# Patient Record
Sex: Male | Born: 1950 | Hispanic: No | Marital: Single | Smoking: Former smoker
Health system: Southern US, Community
[De-identification: ages and names within clinical notes are randomized; demographics above are authoritative.]

## PROBLEM LIST (undated history)

## (undated) DIAGNOSIS — K859 Acute pancreatitis without necrosis or infection, unspecified: Secondary | ICD-10-CM

## (undated) DIAGNOSIS — I10 Essential (primary) hypertension: Secondary | ICD-10-CM

## (undated) DIAGNOSIS — J45909 Unspecified asthma, uncomplicated: Secondary | ICD-10-CM

## (undated) HISTORY — PX: PARATHYROIDECTOMY: SHX19

## (undated) HISTORY — PX: HERNIA REPAIR: SHX51

---

## 2016-04-11 ENCOUNTER — Emergency Department
Admission: EM | Admit: 2016-04-11 | Discharge: 2016-04-11 | Disposition: A | Discharge status: Home / Self Care | Payer: Self-pay | Attending: Emergency Medicine | Admitting: Emergency Medicine

## 2016-04-11 ENCOUNTER — Emergency Department: Payer: Self-pay

## 2016-04-11 ENCOUNTER — Encounter: Payer: Self-pay | Admitting: Emergency Medicine

## 2016-04-11 DIAGNOSIS — R42 Dizziness and giddiness: Secondary | ICD-10-CM

## 2016-04-11 DIAGNOSIS — N289 Disorder of kidney and ureter, unspecified: Secondary | ICD-10-CM | POA: Insufficient documentation

## 2016-04-11 DIAGNOSIS — Z87891 Personal history of nicotine dependence: Secondary | ICD-10-CM | POA: Insufficient documentation

## 2016-04-11 DIAGNOSIS — I951 Orthostatic hypotension: Secondary | ICD-10-CM | POA: Insufficient documentation

## 2016-04-11 HISTORY — DX: Acute pancreatitis without necrosis or infection, unspecified: K85.90

## 2016-04-11 LAB — CBC
HCT: 45.3 % (ref 40.0–52.0)
Hemoglobin: 15.4 g/dL (ref 13.0–18.0)
MCH: 30.6 pg (ref 26.0–34.0)
MCHC: 33.9 g/dL (ref 32.0–36.0)
MCV: 90.1 fL (ref 80.0–100.0)
PLATELETS: 241 10*3/uL (ref 150–440)
RBC: 5.03 MIL/uL (ref 4.40–5.90)
RDW: 12.8 % (ref 11.5–14.5)
WBC: 5.3 10*3/uL (ref 3.8–10.6)

## 2016-04-11 LAB — BASIC METABOLIC PANEL
Anion gap: 10 (ref 5–15)
BUN: 17 mg/dL (ref 6–20)
CO2: 21 mmol/L — ABNORMAL LOW (ref 22–32)
CREATININE: 1.51 mg/dL — AB (ref 0.61–1.24)
Calcium: 9.3 mg/dL (ref 8.9–10.3)
Chloride: 107 mmol/L (ref 101–111)
GFR calc Af Amer: 55 mL/min — ABNORMAL LOW (ref >60)
GFR, EST NON AFRICAN AMERICAN: 47 mL/min — AB (ref >60)
Glucose, Bld: 110 mg/dL — ABNORMAL HIGH (ref 65–99)
POTASSIUM: 4.1 mmol/L (ref 3.5–5.1)
SODIUM: 138 mmol/L (ref 135–145)

## 2016-04-11 MED ORDER — MECLIZINE HCL 25 MG PO TABS
25.0000 mg | ORAL_TABLET | Freq: Once | ORAL | Status: AC
  Administered 2016-04-11: 25 mg via ORAL
  Filled 2016-04-11: qty 1

## 2016-04-11 MED ORDER — MECLIZINE HCL 25 MG PO TABS
ORAL_TABLET | ORAL | Status: AC
  Filled 2016-04-11: qty 1

## 2016-04-11 MED ORDER — SODIUM CHLORIDE 0.9 % IV BOLUS (SEPSIS)
1000.0000 mL | Freq: Once | INTRAVENOUS | Status: AC
  Administered 2016-04-11: 1000 mL via INTRAVENOUS

## 2016-04-11 MED ORDER — MECLIZINE HCL 32 MG PO TABS: 32.0000 mg | ORAL_TABLET | Freq: Three times a day (TID) | ORAL | 0 refills | Status: DC | PRN

## 2016-04-11 MED ORDER — IOPAMIDOL (ISOVUE-370) INJECTION 76%
75.0000 mL | Freq: Once | INTRAVENOUS | Status: AC | PRN
  Administered 2016-04-11: 75 mL via INTRAVENOUS

## 2016-04-11 MED ORDER — ONDANSETRON HCL 4 MG/2ML IJ SOLN
4.0000 mg | Freq: Once | INTRAMUSCULAR | Status: AC
  Administered 2016-04-11: 4 mg via INTRAVENOUS

## 2016-04-11 MED ORDER — MECLIZINE HCL 25 MG PO TABS
25.0000 mg | ORAL_TABLET | Freq: Once | ORAL | Status: AC
  Administered 2016-04-11: 25 mg via ORAL

## 2016-04-11 MED ORDER — ONDANSETRON HCL 4 MG/2ML IJ SOLN
INTRAMUSCULAR | Status: AC
  Filled 2016-04-11: qty 2

## 2016-04-11 NOTE — Discharge Instructions (Signed)
Please drink plenty of fluid to stay well hydrated, and to improve your kidney function. Please make an appointment with your primary care doctor to have your kidney function I and your blood pressure and heart rate rechecked.  Return to the emergency department if you develop severe pain, lightheadedness or dizziness, numbness tingling or weakness, chest pain, fainting, fever, or any other symptoms concerning to you.

## 2016-04-11 NOTE — ED Notes (Signed)
Nystagmus has resolved

## 2016-04-11 NOTE — ED Notes (Signed)
Pt c/o dizziness when bending over or turning head quickly for last 3 days. Denies any pain, NVD or SHOB. No weakness. Has had nasal congestion and posterior nasal drainage that he reports as yellow.

## 2016-04-11 NOTE — ED Provider Notes (Signed)
[facility] Emergency Department Provider Note  ____________________________________________  Time seen: Approximately 11:29 AM  I have reviewed the triage vital signs and the nursing notes.   HISTORY  Chief Complaint Dizziness    HPI Jeremy Frazier is a 65 y.o. male with no history of hypertension, hyperlipidemia or diabetes, no previous stroke, presenting with dizziness.The patient reports that several days ago he developed congestion without cough, sore throat, ear pain, or shortness of breath. 2 days ago, he was getting out of bed when he had the acute onset of room spinning dizziness with mild nausea. This lasted approximately 2-3 minutes. He has had multiple daily episodes that are similar in character and severity since then. He has had a mild but not severe headache, and denies any numbness tingling or weakness. No recent trauma, or changes in his medications. He does have a history of vertigo cannot remember if this is similar to his previous symptoms. His symptoms have not improved with Dramamine, and are worsened with head movements to the left or bending over.   Past Medical History:  Diagnosis Date  . Pancreatitis     There are no active problems to display for this patient.   Past Surgical History:  Procedure Laterality Date  . HERNIA REPAIR    . PARATHYROIDECTOMY        Allergies Morphine and related  No family history on file.  Social History Social History  Substance Use Topics  . Smoking status: Former Games developermoker  . Smokeless tobacco: Never Used  . Alcohol use No    Review of Systems Constitutional: No fever/chills.No lightheadedness. Positive dizziness. No syncope. Eyes: No visual changes. No blurred or double vision. ENT: No sore throat. Positive congestion without rhinorrhea. Cardiovascular: Denies chest pain. Denies palpitations. Respiratory: Denies shortness of breath.  No cough. Gastrointestinal: No abdominal pain.  No  nausea, no vomiting.  No diarrhea.  No constipation. No bloody stool or melena. Genitourinary: Negative for dysuria. Musculoskeletal: Negative for back pain. Skin: Negative for rash. Neurological: Positive for mild headache. No focal numbness, tingling or weakness. No difficulty walking. Positive dizziness.  10-point ROS otherwise negative.  ____________________________________________   PHYSICAL EXAM:  VITAL SIGNS: ED Triage Vitals  Enc Vitals Group     BP 04/11/16 0930 (!) 142/102     Pulse Rate 04/11/16 0930 91     Resp 04/11/16 0930 18     Temp 04/11/16 0930 97.8 F (36.6 C)     Temp Source 04/11/16 0930 Oral     SpO2 04/11/16 0930 97 %     Weight 04/11/16 0928 220 lb (99.8 kg)     Height 04/11/16 0928 6' (1.829 m)     Head Circumference --      Peak Flow --      Pain Score --      Pain Loc --      Pain Edu? --      Excl. in GC? --     Constitutional: Alert and oriented. Well appearing and in no acute distress. Answers questions appropriately. Eyes: Conjunctivae are normal.  EOMI. PERRLA. No scleral icterus. No horizontal or vertical nystagmus. EARS: TMs are clear without bulge, erythema or fluid bilaterally. Canals are clear as well. Head: Atraumatic. Nose: Positive congestion without rhinorrhea. Mouth/Throat: Mucous membranes are moist. No posterior pharyngeal erythema. No tonsillar swelling or exudate. Posterior palate is symmetric and uvula is midline. Neck: No stridor.  Supple.  No JVD. No meningismus. Cardiovascular: Normal rate, regular rhythm. No  murmurs, rubs or gallops.  Respiratory: Normal respiratory effort.  No accessory muscle use or retractions. Lungs CTAB.  No wheezes, rales or ronchi. Gastrointestinal: Soft, nontender and nondistended.  No guarding or rebound.  No peritoneal signs. Musculoskeletal: No LE edema. No ttp in the calves or palpable cords.  Negative Homan's sign. Neurologic: Alert and oriented 3. Speech is clear.  Face and smile symmetric.  Tongue is midline. EOMI, PERRLA, no horizontal or vertical nystagmus. No pronator drift. 5 out of 5 grip, biceps, triceps, hip flexors, plantar flexion and dorsiflexion. Normal sensation to light touch in the bilateral upper and lower extremities, and face. Normal heel-to-shin. Normal finger-nose-finger. Skin:  Skin is warm, dry and intact. No rash noted. Psychiatric: Mood and affect are normal. Speech and behavior are normal.  Normal judgement.  ____________________________________________   LABS (all labs ordered are listed, but only abnormal results are displayed)  Labs Reviewed  BASIC METABOLIC PANEL - Abnormal; Notable for the following:       Result Value   CO2 21 (*)    Glucose, Bld 110 (*)    Creatinine, Ser 1.51 (*)    GFR calc non Af Amer 47 (*)    GFR calc Af Amer 55 (*)    All other components within normal limits  CBC   ____________________________________________  EKG  ED ECG REPORT I, Rockne Menghini, the attending physician, personally viewed and interpreted this ECG.   Date: 04/11/2016  EKG Time: 929  Rate: 86  Rhythm: normal sinus rhythm  Axis: normal  Intervals:none  ST&T Change: No ST elevation.  ____________________________________________  RADIOLOGY  Ct Angio Head W Or Wo Contrast  Result Date: 04/11/2016 CLINICAL DATA:  Vertigo. Dizziness when bending over or turning the head quickly for the past 3 days. Nasal congestion and posterior nasal drainage. EXAM: CT ANGIOGRAPHY HEAD AND NECK TECHNIQUE: Multidetector CT imaging of the head and neck was performed using the standard protocol during bolus administration of intravenous contrast. Multiplanar CT image reconstructions and MIPs were obtained to evaluate the vascular anatomy. Carotid stenosis measurements (when applicable) are obtained utilizing NASCET criteria, using the distal internal carotid diameter as the denominator. CONTRAST:  75 mL Isovue 370 COMPARISON:  Head CT 04/11/2016 FINDINGS: CTA  NECK FINDINGS Aortic arch: Normal variant aortic arch branching pattern with common origin of the brachiocephalic and left common carotid arteries. Arch vessel origins are widely patent. Right carotid system: Proximal common carotid artery partially obscured by streak artifact from dense venous contrast. Otherwise patent without evidence of stenosis or dissection. Left carotid system: Patent without evidence of stenosis or dissection. Tortuous proximal common carotid artery. Vertebral arteries: Proximal right V1 segment including its origin is completely obscured by streak artifact from venous contrast. The remainder of the right vertebral artery is patent and unremarkable, as is the left vertebral artery. Vessels are codominant. Skeleton: Advanced lower cervical disc degeneration with moderate to severe neural foraminal stenosis at C6-7 and C7-T1. Other neck: No evidence of acute abnormality or mass. Upper chest: Unremarkable. Review of the MIP images confirms the above findings CTA HEAD FINDINGS Anterior circulation: Internal carotid arteries are widely patent from skullbase to carotid termini. ACAs and MCAs are patent without evidence of major branch occlusion or significant stenosis. Anterior communicating artery is unremarkable. No aneurysm. Posterior circulation: Intracranial vertebral arteries are widely patent to the basilar and codominant. PICA, AICA, and SCA origins are patent. Basilar artery is widely patent. There are small patent scratched of there small posterior communicating arteries bilaterally.  PCAs are patent without evidence of significant stenosis. No aneurysm. Venous sinuses: Patent. Anatomic variants: None. Delayed phase: No abnormal enhancement. Review of the MIP images confirms the above findings IMPRESSION: 1. No evidence of major vessel occlusion, significant stenosis, or dissection involving the major intracranial and cervical arterial vasculature. Suboptimal assessment of the proximal  right vertebral and proximal right common carotid arteries due to artifact. 2. Advanced lower cervical disc degeneration. Electronically Signed   By: Sebastian AcheAllen  Grady M.D.   On: 04/11/2016 12:55   Ct Head Wo Contrast  Result Date: 04/11/2016 CLINICAL DATA:  Dizziness. EXAM: CT HEAD WITHOUT CONTRAST TECHNIQUE: Contiguous axial images were obtained from the base of the skull through the vertex without intravenous contrast. COMPARISON:  None. FINDINGS: Brain: No mass lesion, hemorrhage, hydrocephalus, acute infarct, intra-axial, or extra-axial fluid collection. Vascular: No hyperdense vessel or unexpected calcification. Skull: Normal Sinuses/Orbits: Normal imaged orbits. Clear paranasal sinuses and mastoid air cells. Other: None IMPRESSION: Normal head CT for age. Electronically Signed   By: Jeronimo GreavesKyle  Talbot M.D.   On: 04/11/2016 10:31   Ct Angio Neck W And/or Wo Contrast  Result Date: 04/11/2016 CLINICAL DATA:  Vertigo. Dizziness when bending over or turning the head quickly for the past 3 days. Nasal congestion and posterior nasal drainage. EXAM: CT ANGIOGRAPHY HEAD AND NECK TECHNIQUE: Multidetector CT imaging of the head and neck was performed using the standard protocol during bolus administration of intravenous contrast. Multiplanar CT image reconstructions and MIPs were obtained to evaluate the vascular anatomy. Carotid stenosis measurements (when applicable) are obtained utilizing NASCET criteria, using the distal internal carotid diameter as the denominator. CONTRAST:  75 mL Isovue 370 COMPARISON:  Head CT 04/11/2016 FINDINGS: CTA NECK FINDINGS Aortic arch: Normal variant aortic arch branching pattern with common origin of the brachiocephalic and left common carotid arteries. Arch vessel origins are widely patent. Right carotid system: Proximal common carotid artery partially obscured by streak artifact from dense venous contrast. Otherwise patent without evidence of stenosis or dissection. Left carotid system:  Patent without evidence of stenosis or dissection. Tortuous proximal common carotid artery. Vertebral arteries: Proximal right V1 segment including its origin is completely obscured by streak artifact from venous contrast. The remainder of the right vertebral artery is patent and unremarkable, as is the left vertebral artery. Vessels are codominant. Skeleton: Advanced lower cervical disc degeneration with moderate to severe neural foraminal stenosis at C6-7 and C7-T1. Other neck: No evidence of acute abnormality or mass. Upper chest: Unremarkable. Review of the MIP images confirms the above findings CTA HEAD FINDINGS Anterior circulation: Internal carotid arteries are widely patent from skullbase to carotid termini. ACAs and MCAs are patent without evidence of major branch occlusion or significant stenosis. Anterior communicating artery is unremarkable. No aneurysm. Posterior circulation: Intracranial vertebral arteries are widely patent to the basilar and codominant. PICA, AICA, and SCA origins are patent. Basilar artery is widely patent. There are small patent scratched of there small posterior communicating arteries bilaterally. PCAs are patent without evidence of significant stenosis. No aneurysm. Venous sinuses: Patent. Anatomic variants: None. Delayed phase: No abnormal enhancement. Review of the MIP images confirms the above findings IMPRESSION: 1. No evidence of major vessel occlusion, significant stenosis, or dissection involving the major intracranial and cervical arterial vasculature. Suboptimal assessment of the proximal right vertebral and proximal right common carotid arteries due to artifact. 2. Advanced lower cervical disc degeneration. Electronically Signed   By: Sebastian AcheAllen  Grady M.D.   On: 04/11/2016 12:55    ____________________________________________  PROCEDURES  Procedure(s) performed: None  Procedures  Critical Care performed:  No ____________________________________________   INITIAL IMPRESSION / ASSESSMENT AND PLAN / ED COURSE  Pertinent labs & imaging results that were available during my care of the patient were reviewed by me and considered in my medical decision making (see chart for details).  65 y.o. male presenting with several days of self-limited 2-3 minute episodes of dizziness, worse with positional changes. On my examination, the patient has no focal neurologic findings, and his cerebellar testing is within normal limits. He does have some congestion, and the symptoms may be due to vertigo. In addition, the patient is orthostatic on examination with pulse jumping from 88-122 from lying down to standing. We will give him intravenous fluids, treat him with meclizine, and reevaluate the patient.   ----------------------------------------- 11:45 AM on 04/11/2016 -----------------------------------------  At this time, the patient's workup is most consistent with either vertigo or dehydration. He does have a bump in his creatinine, and is orthostatic. I'll given him intravenous fluids for this. He has not noticed a significant improvement with the first dose of meclizine, so we'll give him a second dose and more fluid as well. His CT scan does not show any acute intracranial process, and we'll get a CT angiogram to rule out posterior stroke. If his imaging is reassuring, his vital signs normalize, and he is feeling better, and discharge home with close PMD follow-up.  ____________________________________________  FINAL CLINICAL IMPRESSION(S) / ED DIAGNOSES  Final diagnoses:  Vertigo  Acute renal insufficiency  Orthostasis    Clinical Course       NEW MEDICATIONS STARTED DURING THIS VISIT:  Discharge Medication List as of 04/11/2016  1:11 PM    START taking these medications   Details  meclizine (ANTIVERT) 32 MG tablet Take 1 tablet (32 mg total) by mouth 3 (three) times daily as needed.,  Starting Sun 04/11/2016, Print          Rockne Menghini, MD 04/11/16 1534

## 2016-04-11 NOTE — ED Triage Notes (Addendum)
Pt presents to ED wit reports of dizzy spells for two days. Pt denies any pain, SOB, or NVD. Pt reports nasal congestion. Pt reports taking dramamine with no relief.

## 2017-09-09 ENCOUNTER — Encounter: Payer: Self-pay | Admitting: Emergency Medicine

## 2017-09-09 ENCOUNTER — Emergency Department
Admission: EM | Admit: 2017-09-09 | Discharge: 2017-09-10 | Disposition: A | Discharge status: Home / Self Care | Payer: Non-veteran care | Attending: Emergency Medicine | Admitting: Emergency Medicine

## 2017-09-09 ENCOUNTER — Emergency Department: Payer: Non-veteran care

## 2017-09-09 DIAGNOSIS — R0602 Shortness of breath: Secondary | ICD-10-CM | POA: Diagnosis present

## 2017-09-09 DIAGNOSIS — Z79899 Other long term (current) drug therapy: Secondary | ICD-10-CM | POA: Insufficient documentation

## 2017-09-09 DIAGNOSIS — J441 Chronic obstructive pulmonary disease with (acute) exacerbation: Secondary | ICD-10-CM | POA: Insufficient documentation

## 2017-09-09 DIAGNOSIS — Z87891 Personal history of nicotine dependence: Secondary | ICD-10-CM | POA: Insufficient documentation

## 2017-09-09 LAB — BASIC METABOLIC PANEL
Anion gap: 8 (ref 5–15)
BUN: 15 mg/dL (ref 6–20)
CALCIUM: 9.3 mg/dL (ref 8.9–10.3)
CO2: 22 mmol/L (ref 22–32)
CREATININE: 1.51 mg/dL — AB (ref 0.61–1.24)
Chloride: 108 mmol/L (ref 101–111)
GFR calc non Af Amer: 47 mL/min — ABNORMAL LOW (ref >60)
GFR, EST AFRICAN AMERICAN: 55 mL/min — AB (ref >60)
Glucose, Bld: 115 mg/dL — ABNORMAL HIGH (ref 65–99)
Potassium: 3.9 mmol/L (ref 3.5–5.1)
SODIUM: 138 mmol/L (ref 135–145)

## 2017-09-09 LAB — CBC
HCT: 40.6 % (ref 40.0–52.0)
Hemoglobin: 13.9 g/dL (ref 13.0–18.0)
MCH: 30.5 pg (ref 26.0–34.0)
MCHC: 34.1 g/dL (ref 32.0–36.0)
MCV: 89.4 fL (ref 80.0–100.0)
PLATELETS: 259 10*3/uL (ref 150–440)
RBC: 4.55 MIL/uL (ref 4.40–5.90)
RDW: 12.5 % (ref 11.5–14.5)
WBC: 5.6 10*3/uL (ref 3.8–10.6)

## 2017-09-09 LAB — FIBRIN DERIVATIVES D-DIMER (ARMC ONLY): Fibrin derivatives D-dimer (ARMC): 415.57 ng/mL (FEU) (ref 0.00–499.00)

## 2017-09-09 LAB — TROPONIN I

## 2017-09-09 MED ORDER — IPRATROPIUM-ALBUTEROL 0.5-2.5 (3) MG/3ML IN SOLN
3.0000 mL | Freq: Once | RESPIRATORY_TRACT | Status: AC
  Administered 2017-09-09: 3 mL via RESPIRATORY_TRACT
  Filled 2017-09-09: qty 3

## 2017-09-09 MED ORDER — ALBUTEROL SULFATE (2.5 MG/3ML) 0.083% IN NEBU
5.0000 mg | INHALATION_SOLUTION | Freq: Once | RESPIRATORY_TRACT | Status: AC
  Administered 2017-09-09: 5 mg via RESPIRATORY_TRACT
  Filled 2017-09-09: qty 6

## 2017-09-09 MED ORDER — PREDNISONE 20 MG PO TABS
40.0000 mg | ORAL_TABLET | ORAL | Status: AC
  Administered 2017-09-09: 40 mg via ORAL
  Filled 2017-09-09: qty 2

## 2017-09-09 NOTE — ED Triage Notes (Signed)
Patient states he has had intermittent SOB for the last 2 weeks and he was at [facility] yesterday and they gave him an inhaler and Montelukast for breathing relief and he states he has only taken 2 of them but is unsure if they have had time to work.  He states he has not been dx with asthma or other chronic breathing issues.  Pt denies smoking.

## 2017-09-09 NOTE — ED Notes (Signed)
Patient transported to X-ray 

## 2017-09-10 MED ORDER — PREDNISONE 20 MG PO TABS: 40.0000 mg | ORAL_TABLET | Freq: Every day | ORAL | 0 refills | Status: DC

## 2017-09-10 NOTE — Discharge Instructions (Signed)
Your labs and chest xray today were unremarkable.

## 2017-09-10 NOTE — ED Provider Notes (Signed)
[facility] Emergency Department Provider Note  ____________________________________________  Time seen: Approximately 12:01 AM  I have reviewed the triage vital signs and the nursing notes.   HISTORY  Chief Complaint Shortness of Breath    HPI Jeremy Frazier is a 67 y.o. male who complains of shortness of breath on and off for the past 2 weeks. It improves with albuterol and steroids but then reoccurs. He is been followed up with the [place], where they have him on an albuterol inhaler. They do start him on on Singulair yesterday but he is not seeing improvement yet. last steroids is about 2 weeks ago. He also had a course of antibiotics. He denies any fever or productive cough. no aggravating factors. denies chest pain, no recent travel, hospitalization or surgery or history of PE or DVT. Symptoms are moderate severity.  On arrival to the ED he received albuterol and feels much better now.     Past Medical History:  Diagnosis Date  . Pancreatitis      There are no active problems to display for this patient.    Past Surgical History:  Procedure Laterality Date  . HERNIA REPAIR    . PARATHYROIDECTOMY       Prior to Admission medications   Medication Sig Start Date End Date Taking? Authorizing Provider  meclizine (ANTIVERT) 32 MG tablet Take 1 tablet (32 mg total) by mouth 3 (three) times daily as needed. 04/11/16   Rockne Menghini, MD  predniSONE (DELTASONE) 20 MG tablet Take 2 tablets (40 mg total) by mouth daily. 09/10/17   Sharman Cheek, MD     Allergies Morphine and related   No family history on file.  Social History Social History   Tobacco Use  . Smoking status: Former Games developer  . Smokeless tobacco: Never Used  Substance Use Topics  . Alcohol use: No  . Drug use: Not on file    Review of Systems  Constitutional:   No fever or chills.  ENT:   No sore throat. No rhinorrhea. Cardiovascular:   No chest pain or  syncope. Respiratory:   positive shortness of breath without cough. Gastrointestinal:   Negative for abdominal pain, vomiting and diarrhea.  Musculoskeletal:   Negative for focal pain or swelling All other systems reviewed and are negative except as documented above in ROS and HPI.  ____________________________________________   PHYSICAL EXAM:  VITAL SIGNS: ED Triage Vitals  Enc Vitals Group     BP 09/09/17 2115 (!) 146/93     Pulse Rate 09/09/17 2115 (!) 101     Resp 09/09/17 2200 14     Temp 09/09/17 2115 98.5 F (36.9 C)     Temp Source 09/09/17 2115 Oral     SpO2 09/09/17 2115 98 %     Weight 09/09/17 2115 220 lb (99.8 kg)     Height 09/09/17 2115 6' (1.829 m)     Head Circumference --      Peak Flow --      Pain Score 09/09/17 2115 0     Pain Loc --      Pain Edu? --      Excl. in GC? --     Vital signs reviewed, nursing assessments reviewed.   Constitutional:   Alert and oriented. Well appearing and in no distress. Eyes:   Conjunctivae are normal. EOMI. PERRL. ENT      Head:   Normocephalic and atraumatic.      Nose:   No congestion/rhinnorhea.  Mouth/Throat:   MMM, no pharyngeal erythema. No peritonsillar mass.       Neck:   No meningismus. Full ROM. Hematological/Lymphatic/Immunilogical:   No cervical lymphadenopathy. Cardiovascular:   RRR. Symmetric bilateral radial and DP pulses.  No murmurs.  Respiratory:   Normal respiratory effort without tachypnea/retractions. Breath sounds are clear and equal bilaterally. No wheezes/rales/rhonchi.no wheezing or coughing fit provoked with FEV1 maneuver. Gastrointestinal:   Soft and nontender. Non distended. There is no CVA tenderness.  No rebound, rigidity, or guarding. Genitourinary:   deferred Musculoskeletal:   Normal range of motion in all extremities. No joint effusions.  No lower extremity tenderness.  No edema. Neurologic:   Normal speech and language.  Motor grossly intact. No acute focal neurologic  deficits are appreciated.  Skin:    Skin is warm, dry and intact. No rash noted.  No petechiae, purpura, or bullae.  ____________________________________________    LABS (pertinent positives/negatives) (all labs ordered are listed, but only abnormal results are displayed) Labs Reviewed  BASIC METABOLIC PANEL - Abnormal; Notable for the following components:      Result Value   Glucose, Bld 115 (*)    Creatinine, Ser 1.51 (*)    GFR calc non Af Amer 47 (*)    GFR calc Af Amer 55 (*)    All other components within normal limits  CBC  TROPONIN I  FIBRIN DERIVATIVES D-DIMER ([facility] ONLY)   ____________________________________________   EKG  interpreted by me  Date: 09/10/2017  Rate: 81  Rhythm: normal sinus rhythm  QRS Axis: normal  Intervals: normal  ST/T Wave abnormalities: normal  Conduction Disutrbances: none  Narrative Interpretation: unremarkable      ____________________________________________    RADIOLOGY  Dg Chest 2 View  Result Date: 09/09/2017 CLINICAL DATA:  Shortness of breath EXAM: CHEST - 2 VIEW COMPARISON:  None. FINDINGS: The lungs are clear without focal pneumonia, edema, pneumothorax or pleural effusion. The cardiopericardial silhouette is within normal limits for size. Accentuated thoracic kyphosis. Thoracolumbar scoliosis evident. Old right rib fracture noted IMPRESSION: No active cardiopulmonary disease. Electronically Signed   By: Kennith CenterEric  Mansell M.D.   On: 09/09/2017 21:50    ____________________________________________   PROCEDURES Procedures  ____________________________________________  DIFFERENTIAL DIAGNOSIS   COPD exacerbation, PE  CLINICAL IMPRESSION / ASSESSMENT AND PLAN / ED COURSE  Pertinent labs & imaging results that were available during my care of the patient were reviewed by me and considered in my medical decision making (see chart for details).    is well-appearing no acute distress, responded well to albuterol, exam is  essentially normal, normal vital signs, very low suspicion for PE, however with his prolonged course of shortness of breath and waxing and waning symptoms, I will get a d-dimer for further risk stratification. If negative I do not think he needs a CT angiogram of the chest. Low suspicion of ACS dissection pericarditis or pneumothorax.  Clinical Course as of Apr 06 0001  Fri Sep 09, 2017  2304 WNL. DC with course of steroids, continue albuterol.   Fibrin derivatives D-dimer [facility]): 415.57 [PS]    Clinical Course User Index [PS] Sharman CheekStafford, Virgen Belland, MD     ____________________________________________   FINAL CLINICAL IMPRESSION(S) / ED DIAGNOSES    Final diagnoses:  COPD exacerbation (HCC)  SOB (shortness of breath)     ED Discharge Orders        Ordered    predniSONE (DELTASONE) 20 MG tablet  Daily     09/10/17 0000  Portions of this note were generated with dragon dictation software. Dictation errors may occur despite best attempts at proofreading.    Sharman Cheek, MD 09/10/17 [phone]

## 2019-01-07 ENCOUNTER — Emergency Department
Admission: EM | Admit: 2019-01-07 | Discharge: 2019-01-07 | Disposition: A | Discharge status: Home / Self Care | Payer: No Typology Code available for payment source | Attending: Emergency Medicine | Admitting: Emergency Medicine

## 2019-01-07 ENCOUNTER — Other Ambulatory Visit: Payer: Self-pay

## 2019-01-07 ENCOUNTER — Encounter: Payer: Self-pay | Admitting: Emergency Medicine

## 2019-01-07 DIAGNOSIS — R0981 Nasal congestion: Secondary | ICD-10-CM | POA: Diagnosis present

## 2019-01-07 DIAGNOSIS — J0100 Acute maxillary sinusitis, unspecified: Secondary | ICD-10-CM | POA: Insufficient documentation

## 2019-01-07 DIAGNOSIS — Z79899 Other long term (current) drug therapy: Secondary | ICD-10-CM | POA: Diagnosis not present

## 2019-01-07 DIAGNOSIS — J45909 Unspecified asthma, uncomplicated: Secondary | ICD-10-CM | POA: Diagnosis not present

## 2019-01-07 DIAGNOSIS — Z87891 Personal history of nicotine dependence: Secondary | ICD-10-CM | POA: Insufficient documentation

## 2019-01-07 HISTORY — DX: Unspecified asthma, uncomplicated: J45.909

## 2019-01-07 MED ORDER — AMOXICILLIN 500 MG PO CAPS
500.0000 mg | ORAL_CAPSULE | Freq: Once | ORAL | Status: AC
  Administered 2019-01-07: 500 mg via ORAL
  Filled 2019-01-07: qty 1

## 2019-01-07 MED ORDER — AMOXICILLIN 500 MG PO TABS: 500.0000 mg | ORAL_TABLET | Freq: Three times a day (TID) | ORAL | 0 refills | Status: DC

## 2019-01-07 NOTE — ED Triage Notes (Signed)
Pt to ED with c/o of congestion that has been ongoing for approx one week. Pt denies cough and SOB.

## 2019-01-07 NOTE — Discharge Instructions (Signed)
Please follow-up with your primary care provider for symptoms that are not improving over the next 2 to 3 days while taking your antibiotic.  You may continue taking your Mucinex in addition to Tylenol if needed.  Return to the ER for symptoms of change or worsen if you are unable to schedule appointment.

## 2019-01-07 NOTE — ED Provider Notes (Signed)
[facility] Emergency Department Provider Note  ____________________________________________  Time seen: Approximately 7:22 PM  I have reviewed the triage vital signs and the nursing notes.   HISTORY  Chief Complaint Nasal Congestion   HPI Jeremy Frazier is a 68 y.o. male who presents to the emergency department for treatment and evaluation  of congestion that is been present for the approximately 1 week. Headache started yesterday.  No fever, cough or shortness of breath.    Past Medical History:  Diagnosis Date  . Asthma   . Pancreatitis     There are no active problems to display for this patient.   Past Surgical History:  Procedure Laterality Date  . HERNIA REPAIR    . PARATHYROIDECTOMY      Prior to Admission medications   Medication Sig Start Date End Date Taking? Authorizing Provider  amoxicillin (AMOXIL) 500 MG tablet Take 1 tablet (500 mg total) by mouth 3 (three) times daily. 01/07/19   Demere Dotzler, Rulon Eisenmengerari B, FNP  meclizine (ANTIVERT) 32 MG tablet Take 1 tablet (32 mg total) by mouth 3 (three) times daily as needed. 04/11/16   Rockne MenghiniNorman, Anne-Caroline, MD  predniSONE (DELTASONE) 20 MG tablet Take 2 tablets (40 mg total) by mouth daily. 09/10/17   Sharman CheekStafford, Phillip, MD    Allergies Morphine and related  No family history on file.  Social History Social History   Tobacco Use  . Smoking status: Former Games developermoker  . Smokeless tobacco: Never Used  Substance Use Topics  . Alcohol use: No  . Drug use: Not on file    Review of Systems Constitutional: Negative for  fever/chills. Normal appetite. ENT: No sore throat. Positive for sinus congestion. Cardiovascular: Denies chest pain. Respiratory: Negative for shortness of breath. Negative for cough. Negative for wheezing.  Gastrointestinal: Negative for nausea,  no vomiting.  no diarrhea.  Musculoskeletal: Negative for for body aches Skin: Negative for rash. Neurological: Positive for  headaches ____________________________________________   PHYSICAL EXAM:  VITAL SIGNS: ED Triage Vitals  Enc Vitals Group     BP 01/07/19 1809 (!) 151/93     Pulse Rate 01/07/19 1809 90     Resp 01/07/19 1809 18     Temp 01/07/19 1809 98.7 F (37.1 C)     Temp Source 01/07/19 1809 Oral     SpO2 01/07/19 1809 99 %     Weight 01/07/19 1812 220 lb (99.8 kg)     Height 01/07/19 1812 6' (1.829 m)     Head Circumference --      Peak Flow --      Pain Score 01/07/19 1811 7     Pain Loc --      Pain Edu? --      Excl. in GC? --     Constitutional: Alert and oriented. Overall well appearing and in no acute distress. Eyes: Conjunctivae are normal. Ears: Bilateral TM normal Nose: Maxillary and ethmoid sinus congestion noted; no rhinnorhea. Mouth/Throat: Mucous membranes are moist.  Oropharynx clear. Tonsils normal. Uvula midline. Neck: No stridor.  Lymphatic: No cervical lymphadenopathy. Cardiovascular: Normal rate, regular rhythm. Good peripheral circulation. Respiratory: Respirations are even and unlabored.  No retractions. Breath sounds clear to auscultation. Gastrointestinal: Soft and nontender.  Musculoskeletal: FROM x 4 extremities.  Neurologic:  Normal speech and language. Skin:  Skin is warm, dry and intact. No rash noted. Psychiatric: Mood and affect are normal. Speech and behavior are normal.  ____________________________________________   LABS (all labs ordered are listed, but only abnormal  results are displayed)  Labs Reviewed - No data to display ____________________________________________  EKG  Not indicated. ____________________________________________  RADIOLOGY  Not indicated. ____________________________________________   PROCEDURES  Procedure(s) performed: None  Critical Care performed: No ____________________________________________   INITIAL IMPRESSION / ASSESSMENT AND PLAN / ED COURSE  68 y.o. male who presents to the emergency  department for treatment and evaluation of nasal congestion that has been present and worsening for the past week.  No relief with Mucinex.  He will be treated with amoxicillin for presumed sinus infection.  He is to follow-up with his primary care provider if not improving with medication over the next few days.  He is to return to the emergency department for symptoms of change or worsen if unable to schedule an appointment.     Medications  amoxicillin (AMOXIL) capsule 500 mg (has no administration in time range)    ED Discharge Orders         Ordered    amoxicillin (AMOXIL) 500 MG tablet  3 times daily     01/07/19 1935           Pertinent labs & imaging results that were available during my care of the patient were reviewed by me and considered in my medical decision making (see chart for details).    If controlled substance prescribed during this visit, 12 month history viewed on the [place] prior to issuing an initial prescription for Schedule II or III opiod. ____________________________________________   FINAL CLINICAL IMPRESSION(S) / ED DIAGNOSES  Final diagnoses:  Acute non-recurrent maxillary sinusitis    Note:  This document was prepared using Dragon voice recognition software and may include unintentional dictation errors.    Victorino Dike, FNP 01/07/19 1937    Nena Polio, MD 01/07/19 2138

## 2019-06-20 ENCOUNTER — Other Ambulatory Visit: Payer: Self-pay

## 2019-06-20 ENCOUNTER — Encounter: Payer: Self-pay | Admitting: Emergency Medicine

## 2019-06-20 ENCOUNTER — Emergency Department
Admission: EM | Admit: 2019-06-20 | Discharge: 2019-06-20 | Disposition: A | Discharge status: Home / Self Care | Payer: No Typology Code available for payment source | Attending: Emergency Medicine | Admitting: Emergency Medicine

## 2019-06-20 DIAGNOSIS — Z87891 Personal history of nicotine dependence: Secondary | ICD-10-CM | POA: Insufficient documentation

## 2019-06-20 DIAGNOSIS — Z79899 Other long term (current) drug therapy: Secondary | ICD-10-CM | POA: Diagnosis not present

## 2019-06-20 DIAGNOSIS — J45909 Unspecified asthma, uncomplicated: Secondary | ICD-10-CM | POA: Insufficient documentation

## 2019-06-20 DIAGNOSIS — K029 Dental caries, unspecified: Secondary | ICD-10-CM | POA: Insufficient documentation

## 2019-06-20 DIAGNOSIS — K0889 Other specified disorders of teeth and supporting structures: Secondary | ICD-10-CM | POA: Diagnosis present

## 2019-06-20 MED ORDER — AMOXICILLIN 500 MG PO CAPS: 500.0000 mg | ORAL_CAPSULE | Freq: Three times a day (TID) | ORAL | 0 refills | Status: DC

## 2019-06-20 MED ORDER — IBUPROFEN 600 MG PO TABS: 600.0000 mg | ORAL_TABLET | Freq: Three times a day (TID) | ORAL | 0 refills | Status: DC | PRN

## 2019-06-20 MED ORDER — TRAMADOL HCL 50 MG PO TABS: 50.0000 mg | ORAL_TABLET | Freq: Four times a day (QID) | ORAL | 0 refills | Status: DC | PRN

## 2019-06-20 MED ORDER — LIDOCAINE VISCOUS HCL 2 % MT SOLN
15.0000 mL | Freq: Once | OROMUCOSAL | Status: AC
  Administered 2019-06-20: 12:00:00 15 mL via OROMUCOSAL
  Filled 2019-06-20: qty 15

## 2019-06-20 NOTE — Discharge Instructions (Addendum)
Follow discharge care instruction.  Take medication as directed.  Establish care from list of dental clinics provided you discharge.  OPTIONS FOR DENTAL FOLLOW UP CARE  [place] [facility] - Local Safety Net Dental Clinics [email or web address]   [facility] [phone])  [facility] [phone])  [facility] [phone] ext [phone])  [facility] [phone])  [facility] [phone]) This clinic caters to the indigent population and is on a lottery system. Location: [employer] of Dentistry, [employer], [address] [place] Clinic Hours: Wednesdays from 6pm - 9pm, patients seen by a lottery system. For dates, call or go to [email or web address] Services: Cleanings, fillings and simple extractions. Payment Options: DENTAL WORK IS FREE OF CHARGE. Bring proof of income or support. Best way to get seen: Arrive at 5:15 pm - this is a lottery, NOT first come/first serve, so arriving earlier will not increase your chances of being seen.     [facility] [phone] Select option 1 for emergencies   Location: [facility] of Dentistry, [place], [address] [place] Clinic Hours: No walk-ins accepted - call the day before to schedule an appointment. Check in times are 9:30 am and 1:30 pm. Services: Simple extractions, temporary fillings, pulpectomy/pulp debridement, uncomplicated abscess drainage. Payment Options: PAYMENT IS DUE AT THE TIME OF SERVICE.  Fee is usually $100-200, additional surgical procedures (e.g. abscess drainage) may be extra. Cash, checks, Visa/MasterCard accepted.  Can file Medicaid if patient is covered for dental - patient should call case worker to check. No discount for [facility] patients. Best way to get seen: MUST call the day before and get onto  the schedule. Can usually be seen the next 1-2 days. No walk-ins accepted.     [facility] [phone]   Location: [facility], [address] [place] Clinic Hours: M, W, Th, F 8am or 1:30pm, Tues 9a or 1:30 - first come/first served. Services: Simple extractions, temporary fillings, uncomplicated abscess drainage.  You do not need to be an [facility] resident. Payment Options: PAYMENT IS DUE AT THE TIME OF SERVICE. Dental insurance, otherwise sliding scale - bring proof of income or support. Depending on income and treatment needed, cost is usually $50-200. Best way to get seen: Arrive early as it is first come/first served.     [facility] [phone]   Location: [address] Clinic Hours: Mon-Thu 8a-5p Services: Most basic dental services including extractions and fillings. Payment Options: PAYMENT IS DUE AT THE TIME OF SERVICE. Sliding scale, up to 50% off - bring proof if income or support. Medicaid with dental option accepted. Best way to get seen: Call to schedule an appointment, can usually be seen within 2 weeks OR they will try to see walk-ins - show up at 8a or 2p (you may have to wait).     [facility] [phone] [place] RESIDENTS ONLY   Location: [facility], [address] [place], [place] [address] Clinic Hours: By appointment only. Monday - Thursday 8am-5pm, Friday 8am-12pm Services: Cleanings, fillings, extractions. Payment Options: PAYMENT IS DUE AT THE TIME OF SERVICE. Cash, Visa or MasterCard. Sliding scale - $30 minimum per service. Best way to get seen: Come in to office, complete packet and make an appointment - need proof of income or support monies for each household member and proof of [facility] residence. Usually takes about  a month to get in.     [facility] [phone]    Location: [address] [place] Clinic Hours: Walk-in Urgent Care Dental Services are offered Monday-Friday mornings only. The numbers of emergencies accepted daily is limited to the number of providers available. Maximum 15 - Mondays, Wednesdays & Thursdays Maximum 10 - Tuesdays & Fridays Services: You do not need to be a [facility] resident to be seen for a dental emergency. Emergencies are defined as pain, swelling, abnormal bleeding, or dental trauma. Walkins will receive x-rays if needed. NOTE: Dental cleaning is not an emergency. Payment Options: PAYMENT IS DUE AT THE TIME OF SERVICE. Minimum co-pay is $40.00 for uninsured patients. Minimum co-pay is $3.00 for Medicaid with dental coverage. Dental Insurance is accepted and must be presented at time of visit. Medicare does not cover dental. Forms of payment: Cash, credit card, checks. Best way to get seen: If not previously registered with the clinic, walk-in dental registration begins at 7:15 am and is on a first come/first serve basis. If previously registered with the clinic, call to make an appointment.     [facility] [place] ONLY   Location: [address] [place], [place] Clinic Hours: Mon-Thu 10a-2p Services: Extractions only! Payment Options: FREE (donations accepted) - bring proof of income or support Best way to get seen: Call and schedule an appointment OR come at 8am on the 1st Monday of every month (except for holidays) when it is first come/first served.     [facility] [phone]   Location: [place], [place] Clinic Hours: Friday mornings Services, Payment Options, Best way to get seen: Call for info

## 2019-06-20 NOTE — ED Provider Notes (Signed)
[facility] Emergency Department Provider Note   ____________________________________________   First MD Initiated Contact with Patient 06/20/19 1108     (approximate)  I have reviewed the triage vital signs and the nursing notes.   HISTORY  Chief Complaint Dental Pain and Oral Swelling    HPI Jeremy Frazier is a 69 y.o. male patient presents with 3 days of dental pain involving the right upper tooth.  No fever associated with complaint.  Patient rates pain as a 10/10.  Patient described the pain as "achy".  No palliative measure for complaint.         Past Medical History:  Diagnosis Date  . Asthma   . Pancreatitis     There are no problems to display for this patient.   Past Surgical History:  Procedure Laterality Date  . HERNIA REPAIR    . PARATHYROIDECTOMY      Prior to Admission medications   Medication Sig Start Date End Date Taking? Authorizing Provider  amoxicillin (AMOXIL) 500 MG capsule Take 1 capsule (500 mg total) by mouth 3 (three) times daily. 06/20/19   Joni Reining, PA-C  amoxicillin (AMOXIL) 500 MG tablet Take 1 tablet (500 mg total) by mouth 3 (three) times daily. 01/07/19   Triplett, Cari B, FNP  ibuprofen (ADVIL) 600 MG tablet Take 1 tablet (600 mg total) by mouth every 8 (eight) hours as needed. 06/20/19   Joni Reining, PA-C  meclizine (ANTIVERT) 32 MG tablet Take 1 tablet (32 mg total) by mouth 3 (three) times daily as needed. 04/11/16   Rockne Menghini, MD  predniSONE (DELTASONE) 20 MG tablet Take 2 tablets (40 mg total) by mouth daily. 09/10/17   Sharman Cheek, MD  traMADol (ULTRAM) 50 MG tablet Take 1 tablet (50 mg total) by mouth every 6 (six) hours as needed. 06/20/19 06/19/20  Joni Reining, PA-C    Allergies Morphine and related  No family history on file.  Social History Social History   Tobacco Use  . Smoking status: Former Games developer  . Smokeless tobacco: Never Used  Substance Use Topics  .  Alcohol use: No  . Drug use: Not on file    Review of Systems Constitutional: No fever/chills Eyes: No visual changes. ENT: No sore throat. Cardiovascular: Denies chest pain. Respiratory: Denies shortness of breath. Gastrointestinal: No abdominal pain.  No nausea, no vomiting.  No diarrhea.  No constipation.  Pancreatitis Genitourinary: Negative for dysuria. Musculoskeletal: Negative for back pain. Skin: Negative for rash. Neurological: Negative for headaches, focal weakness or numbness. Allergic/Immunilogical: Morphine ____________________________________________   PHYSICAL EXAM:  VITAL SIGNS: ED Triage Vitals  Enc Vitals Group     BP 06/20/19 1111 (!) 146/102     Pulse Rate 06/20/19 1111 99     Resp 06/20/19 1111 18     Temp 06/20/19 1111 98.1 F (36.7 C)     Temp Source 06/20/19 1111 Oral     SpO2 06/20/19 1111 97 %     Weight 06/20/19 1103 220 lb (99.8 kg)     Height 06/20/19 1103 6' (1.829 m)     Head Circumference --      Peak Flow --      Pain Score 06/20/19 1102 10     Pain Loc --      Pain Edu? --      Excl. in GC? --    Constitutional: Alert and oriented. Well appearing and in no acute distress. Mouth/Throat: Mucous membranes are moist.  Oropharynx non-erythematous.  Devitalized tooth #14 with gingiva edema. Hematological/Lymphatic/Immunilogical: No cervical lymphadenopathy. Cardiovascular: Normal rate, regular rhythm. Grossly normal heart sounds.  Good peripheral circulation.  Elevated blood pressure Respiratory: Normal respiratory effort.  No retractions. Lungs CTAB. Skin:  Skin is warm, dry and intact. No rash noted. Psychiatric: Mood and affect are normal. Speech and behavior are normal.  ____________________________________________   LABS (all labs ordered are listed, but only abnormal results are displayed)  Labs Reviewed - No data to  display ____________________________________________  EKG   ____________________________________________  RADIOLOGY  ED MD interpretation:    Official radiology report(s): No results found.  ____________________________________________   PROCEDURES  Procedure(s) performed (including Critical Care):  Procedures   ____________________________________________   INITIAL IMPRESSION / ASSESSMENT AND PLAN / ED COURSE  As part of my medical decision making, I reviewed the following data within the [place] pain secondary to caries.  Patient given discharge care instruction list of dental clinics for follow-up care.  Take medication as directed.    Jeremy Frazier was evaluated in Emergency Department on 06/20/2019 for the symptoms described in the history of present illness. He was evaluated in the context of the global COVID-19 pandemic, which necessitated consideration that the patient might be at risk for infection with the SARS-CoV-2 virus that causes COVID-19. Institutional protocols and algorithms that pertain to the evaluation of patients at risk for COVID-19 are in a state of rapid change based on information released by regulatory bodies including the CDC and federal and state organizations. These policies and algorithms were followed during the patient's care in the ED.       ____________________________________________   FINAL CLINICAL IMPRESSION(S) / ED DIAGNOSES  Final diagnoses:  Pain due to dental caries     ED Discharge Orders         Ordered    amoxicillin (AMOXIL) 500 MG capsule  3 times daily     06/20/19 1122    traMADol (ULTRAM) 50 MG tablet  Every 6 hours PRN     06/20/19 1122    ibuprofen (ADVIL) 600 MG tablet  Every 8 hours PRN     06/20/19 1122           Note:  This document was prepared using Dragon voice recognition software and may include unintentional dictation errors.    Sable Feil,  PA-C 06/20/19 1128    Arta Silence, MD 06/20/19 1347

## 2019-06-20 NOTE — ED Notes (Signed)
Dental pain x 3 days. See triage note

## 2019-06-20 NOTE — ED Triage Notes (Signed)
Pt reports pain to right upper tooth and now with swelling for the past 3 days.

## 2020-05-23 ENCOUNTER — Emergency Department: Payer: No Typology Code available for payment source

## 2020-05-23 ENCOUNTER — Other Ambulatory Visit: Payer: Self-pay

## 2020-05-23 ENCOUNTER — Emergency Department
Admission: EM | Admit: 2020-05-23 | Discharge: 2020-05-23 | Disposition: A | Discharge status: Home / Self Care | Payer: No Typology Code available for payment source | Attending: Emergency Medicine | Admitting: Emergency Medicine

## 2020-05-23 DIAGNOSIS — R0789 Other chest pain: Secondary | ICD-10-CM

## 2020-05-23 DIAGNOSIS — N644 Mastodynia: Secondary | ICD-10-CM | POA: Insufficient documentation

## 2020-05-23 DIAGNOSIS — Z87891 Personal history of nicotine dependence: Secondary | ICD-10-CM | POA: Diagnosis not present

## 2020-05-23 DIAGNOSIS — J45909 Unspecified asthma, uncomplicated: Secondary | ICD-10-CM | POA: Insufficient documentation

## 2020-05-23 LAB — BASIC METABOLIC PANEL
Anion gap: 10 (ref 5–15)
BUN: 15 mg/dL (ref 8–23)
CO2: 22 mmol/L (ref 22–32)
Calcium: 8.9 mg/dL (ref 8.9–10.3)
Chloride: 105 mmol/L (ref 98–111)
Creatinine, Ser: 1.45 mg/dL — ABNORMAL HIGH (ref 0.61–1.24)
GFR, Estimated: 53 mL/min — ABNORMAL LOW (ref >60)
Glucose, Bld: 98 mg/dL (ref 70–99)
Potassium: 4 mmol/L (ref 3.5–5.1)
Sodium: 137 mmol/L (ref 135–145)

## 2020-05-23 LAB — CBC
HCT: 43.3 % (ref 39.0–52.0)
Hemoglobin: 14.3 g/dL (ref 13.0–17.0)
MCH: 30.2 pg (ref 26.0–34.0)
MCHC: 33 g/dL (ref 30.0–36.0)
MCV: 91.4 fL (ref 80.0–100.0)
Platelets: 285 10*3/uL (ref 150–400)
RBC: 4.74 MIL/uL (ref 4.22–5.81)
RDW: 11.9 % (ref 11.5–15.5)
WBC: 5.2 10*3/uL (ref 4.0–10.5)
nRBC: 0 % (ref 0.0–0.2)

## 2020-05-23 LAB — TROPONIN I (HIGH SENSITIVITY): Troponin I (High Sensitivity): 3 ng/L (ref <18)

## 2020-05-23 MED ORDER — KETOROLAC TROMETHAMINE 30 MG/ML IJ SOLN
30.0000 mg | Freq: Once | INTRAMUSCULAR | Status: AC
  Administered 2020-05-23: 16:00:00 30 mg via INTRAMUSCULAR
  Filled 2020-05-23: qty 1

## 2020-05-23 MED ORDER — TRAMADOL HCL 50 MG PO TABS: 50.0000 mg | ORAL_TABLET | Freq: Four times a day (QID) | ORAL | 0 refills | Status: AC | PRN

## 2020-05-23 MED ORDER — METHOCARBAMOL 500 MG PO TABS: 500.0000 mg | ORAL_TABLET | Freq: Three times a day (TID) | ORAL | 0 refills | Status: DC | PRN

## 2020-05-23 NOTE — ED Notes (Signed)
Pt agreeable to stay until 1550 as he has never had toradol IM.

## 2020-05-23 NOTE — ED Provider Notes (Signed)
[facility] Emergency Department Provider Note   ____________________________________________    I have reviewed the triage vital signs and the nursing notes.   HISTORY  Chief Complaint Chest Pain     HPI Jeremy Frazier is a 69 y.o. male presents with chest wall pain.  Patient describes pain in his left breast with palpation.  He denies injury to the area.  He reports he has been working in the yard recently but does not remember injuring it.  Denies shortness of breath.  No nausea vomiting or diaphoresis.  No history of heart disease.  Has not take anything for this.  No calf pain or swelling, no history of DVT  Past Medical History:  Diagnosis Date  . Asthma   . Pancreatitis     There are no problems to display for this patient.   Past Surgical History:  Procedure Laterality Date  . HERNIA REPAIR    . PARATHYROIDECTOMY      Prior to Admission medications   Medication Sig Start Date End Date Taking? Authorizing Provider  amoxicillin (AMOXIL) 500 MG capsule Take 1 capsule (500 mg total) by mouth 3 (three) times daily. 06/20/19   Joni Reining, PA-C  amoxicillin (AMOXIL) 500 MG tablet Take 1 tablet (500 mg total) by mouth 3 (three) times daily. 01/07/19   Triplett, Cari B, FNP  ibuprofen (ADVIL) 600 MG tablet Take 1 tablet (600 mg total) by mouth every 8 (eight) hours as needed. 06/20/19   Joni Reining, PA-C  meclizine (ANTIVERT) 32 MG tablet Take 1 tablet (32 mg total) by mouth 3 (three) times daily as needed. 04/11/16   Rockne Menghini, MD  methocarbamol (ROBAXIN) 500 MG tablet Take 1 tablet (500 mg total) by mouth every 8 (eight) hours as needed for muscle spasms. 05/23/20   Jene Every, MD  predniSONE (DELTASONE) 20 MG tablet Take 2 tablets (40 mg total) by mouth daily. 09/10/17   Sharman Cheek, MD  traMADol (ULTRAM) 50 MG tablet Take 1 tablet (50 mg total) by mouth every 6 (six) hours as needed. 05/23/20 05/23/21  Jene Every,  MD     Allergies Morphine and related  History reviewed. No pertinent family history.  Social History Social History   Tobacco Use  . Smoking status: Former Games developer  . Smokeless tobacco: Never Used  Substance Use Topics  . Alcohol use: No    Review of Systems  Constitutional: No fever/chills Eyes: No visual changes.  ENT: No sore throat. Cardiovascular: As above Respiratory: Denies shortness of breath. Gastrointestinal: No abdominal pain.  No nausea, no vomiting.   Genitourinary: Negative for dysuria. Musculoskeletal: Negative for back pain. Skin: Negative for rash. Neurological: Negative for headaches   ____________________________________________   PHYSICAL EXAM:  VITAL SIGNS: ED Triage Vitals [05/23/20 1232]  Enc Vitals Group     BP (!) 147/97     Pulse Rate 98     Resp 18     Temp 98.1 F (36.7 C)     Temp Source Oral     SpO2 98 %     Weight 102.1 kg (225 lb)     Height 1.829 m (6')     Head Circumference      Peak Flow      Pain Score 7     Pain Loc      Pain Edu?      Excl. in GC?     Constitutional: Alert and oriented.   Nose: No congestion/rhinnorhea. Mouth/Throat:  Mucous membranes are moist.   Neck:  Painless ROM Cardiovascular: Normal rate, regular rhythm. Grossly normal heart sounds.  Good peripheral circulation.  Point tenderness to palpation just above the left nipple, no bruising or rash or swelling.  No masses felt Respiratory: Normal respiratory effort.  No retractions. Lungs CTAB. Gastrointestinal: Soft and nontender. No distention.  No CVA tenderness.  Musculoskeletal: No lower extremity tenderness nor edema.  Warm and well perfused Neurologic:  Normal speech and language. No gross focal neurologic deficits are appreciated.  Skin:  Skin is warm, dry and intact. No rash noted. Psychiatric: Mood and affect are normal. Speech and behavior are normal.  ____________________________________________   LABS (all labs ordered are  listed, but only abnormal results are displayed)  Labs Reviewed  BASIC METABOLIC PANEL - Abnormal; Notable for the following components:      Result Value   Creatinine, Ser 1.45 (*)    GFR, Estimated 53 (*)    All other components within normal limits  CBC  TROPONIN I (HIGH SENSITIVITY)   ____________________________________________  EKG  ED ECG REPORT I, Jene Every, the attending physician, personally viewed and interpreted this ECG.  Date: 05/23/2020  Rhythm: normal sinus rhythm QRS Axis: normal Intervals: normal ST/T Wave abnormalities: normal Narrative Interpretation: no evidence of acute ischemia  ____________________________________________  RADIOLOGY  Chest x-ray viewed by me, no infiltrate or effusion ____________________________________________   PROCEDURES  Procedure(s) performed: No  Procedures   Critical Care performed: No ____________________________________________   INITIAL IMPRESSION / ASSESSMENT AND PLAN / ED COURSE  Pertinent labs & imaging results that were available during my care of the patient were reviewed by me and considered in my medical decision making (see chart for details).  Patient well-appearing and in no acute distress.  He has point tenderness to the left chest wall/pectoralis muscle.  No bruising or injury to the area.  Suspicious for musculoskeletal injury.  Chest x-ray is reassuring.  EKG is unremarkable, troponin is normal.  Lab work is otherwise unremarkable.  Recommend supportive care, muscle relaxer prescribed analgesics    ____________________________________________   FINAL CLINICAL IMPRESSION(S) / ED DIAGNOSES  Final diagnoses:  Chest wall pain        Note:  This document was prepared using Dragon voice recognition software and may include unintentional dictation errors.   Jene Every, MD 05/23/20 [phone]

## 2020-05-23 NOTE — ED Triage Notes (Signed)
PT to ED for CP since Wednesday. Was working in his yard when it came on. Does not make him shob, or light headed. Does not go into his back or neck.  States it is sore to touch, starting under left armpit but has since moved to mid/left chest.

## 2021-07-13 IMAGING — CR DG CHEST 2V
1 series · 2 of 2 positions shown · non-contrast
Comparison: September 09, 2017.

CLINICAL DATA: Chest pain.

EXAM:
CHEST - 2 VIEW

[Series 2: w chest pa · 0.14mm/px · 2 of 2 slices shown]
[im 1/2]
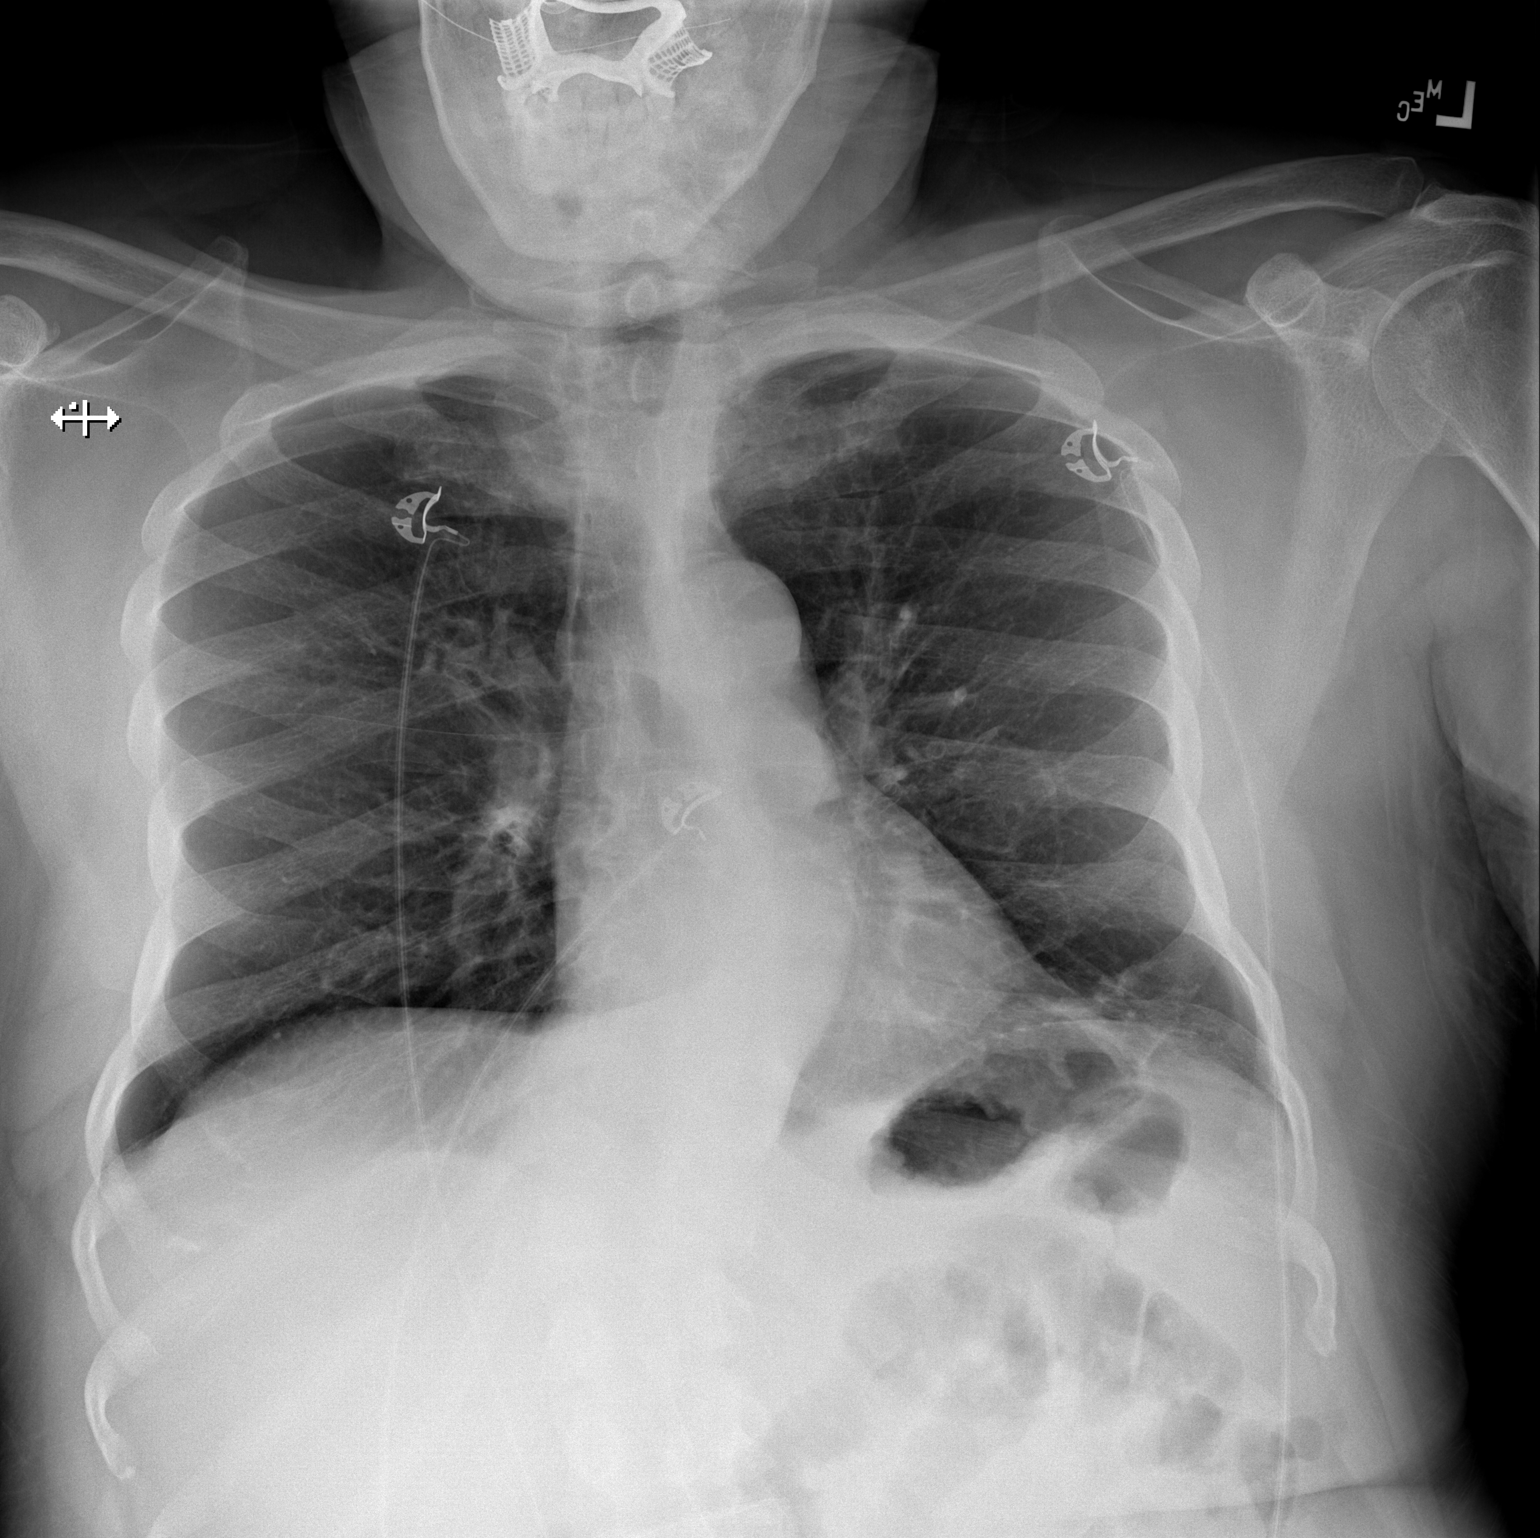
[im 2/2]
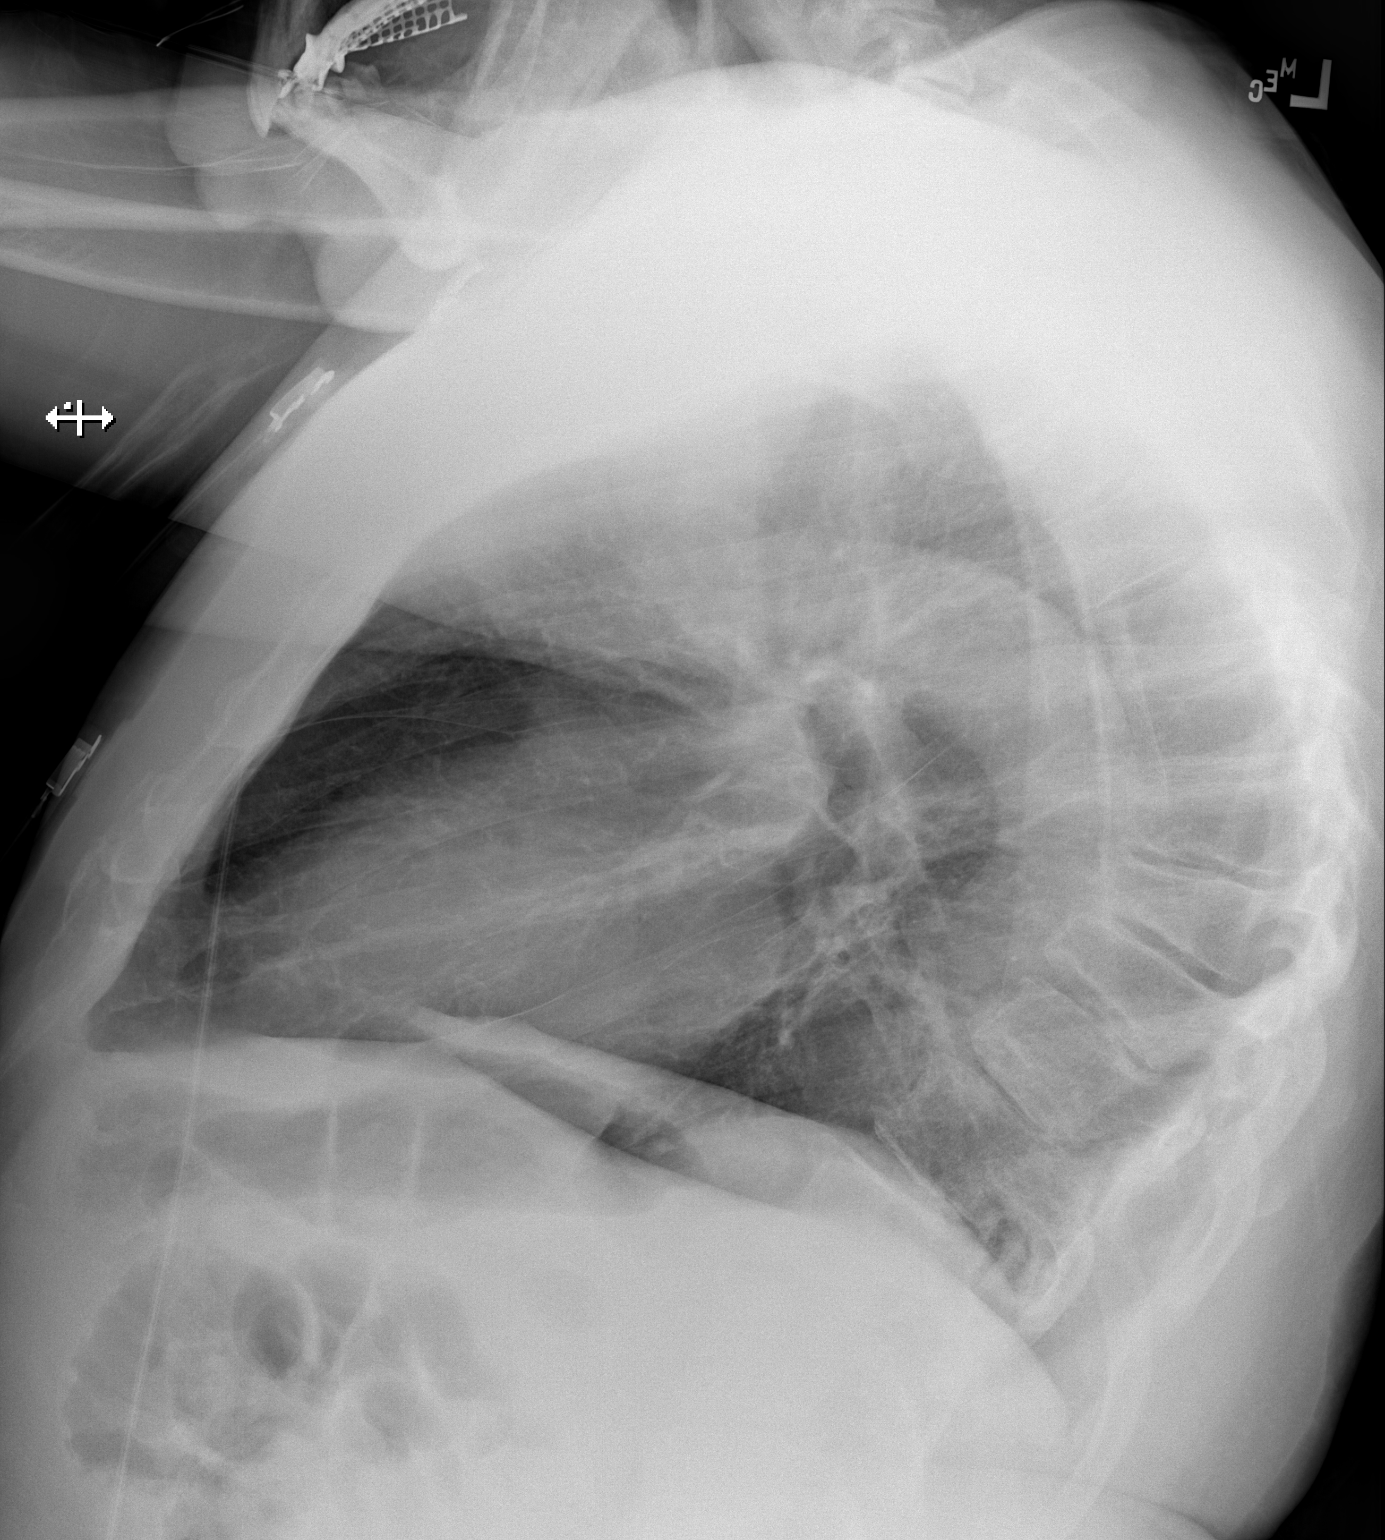

[2 of 2 positions shown; findings below may reference images not displayed]

FINDINGS: The heart size and mediastinal contours are within normal limits.
Both lungs are clear. No pneumothorax or pleural effusion is noted.
The visualized skeletal structures are unremarkable.
IMPRESSION: No active cardiopulmonary disease.

## 2021-07-17 ENCOUNTER — Emergency Department
Admission: EM | Admit: 2021-07-17 | Discharge: 2021-07-17 | Disposition: A | Discharge status: Home / Self Care | Payer: No Typology Code available for payment source | Attending: Emergency Medicine | Admitting: Emergency Medicine

## 2021-07-17 ENCOUNTER — Other Ambulatory Visit: Payer: Self-pay

## 2021-07-17 ENCOUNTER — Encounter: Payer: Self-pay | Admitting: Emergency Medicine

## 2021-07-17 DIAGNOSIS — Z20822 Contact with and (suspected) exposure to covid-19: Secondary | ICD-10-CM | POA: Insufficient documentation

## 2021-07-17 DIAGNOSIS — J069 Acute upper respiratory infection, unspecified: Secondary | ICD-10-CM | POA: Insufficient documentation

## 2021-07-17 DIAGNOSIS — Z87891 Personal history of nicotine dependence: Secondary | ICD-10-CM | POA: Diagnosis not present

## 2021-07-17 DIAGNOSIS — J45909 Unspecified asthma, uncomplicated: Secondary | ICD-10-CM | POA: Insufficient documentation

## 2021-07-17 DIAGNOSIS — R0981 Nasal congestion: Secondary | ICD-10-CM | POA: Diagnosis present

## 2021-07-17 DIAGNOSIS — R030 Elevated blood-pressure reading, without diagnosis of hypertension: Secondary | ICD-10-CM

## 2021-07-17 DIAGNOSIS — I10 Essential (primary) hypertension: Secondary | ICD-10-CM | POA: Diagnosis not present

## 2021-07-17 LAB — RESP PANEL BY RT-PCR (FLU A&B, COVID) ARPGX2
Influenza A by PCR: NEGATIVE
Influenza B by PCR: NEGATIVE
SARS Coronavirus 2 by RT PCR: NEGATIVE

## 2021-07-17 MED ORDER — PROMETHAZINE-DM 6.25-15 MG/5ML PO SYRP: 5.0000 mL | ORAL_SOLUTION | Freq: Four times a day (QID) | ORAL | 0 refills | Status: DC | PRN

## 2021-07-17 NOTE — ED Notes (Signed)
First Nurse Note:  Pt to ED via POV c/o headache, nausea, and chest and head congestion. Pt is in NAD.

## 2021-07-17 NOTE — ED Provider Notes (Signed)
[facility] Provider Note    Event Date/Time   First MD Initiated Contact with Patient 07/17/21 1035     (approximate)   History   Nasal Congestion   HPI  Jeremy Frazier is a 71 y.o. male presents to the ED with complaint of headache, congestion and cough for the last 3 days.  Patient is unaware of any fever.  Patient denies any known exposure to COVID.  He has a history of bronchitis and is a former smoker.  He has a history of asthma and pancreatitis.  He has taken Mucinex over-the-counter without any improvement of the mucus or coughing.  He rates his pain as a 7 out of 10.     Physical Exam   Triage Vital Signs: ED Triage Vitals  Enc Vitals Group     BP 07/17/21 1026 (!) 154/101     Pulse Rate 07/17/21 1026 85     Resp 07/17/21 1026 18     Temp 07/17/21 1026 98 F (36.7 C)     Temp Source 07/17/21 1026 Oral     SpO2 07/17/21 1026 95 %     Weight 07/17/21 1025 230 lb (104.3 kg)     Height 07/17/21 1025 6' (1.829 m)     Head Circumference --      Peak Flow --      Pain Score 07/17/21 1025 7     Pain Loc --      Pain Edu? --      Excl. in GC? --     Most recent vital signs: Vitals:   07/17/21 1026 07/17/21 1141  BP: (!) 154/101 (!) 147/105  Pulse: 85 80  Resp: 18 18  Temp: 98 F (36.7 C)   SpO2: 95% 96%     General: Awake, no distress.  Talkative in complete sentences without any noted shortness of breath. CV:  Good peripheral perfusion.  Heart regular rate and rhythm without murmur. Resp:  Normal effort.  Lungs are clear bilaterally. Abd:  No distention.     ED Results / Procedures / Treatments   Labs (all labs ordered are listed, but only abnormal results are displayed) Labs Reviewed  RESP PANEL BY RT-PCR (FLU A&B, COVID) [id number]      PROCEDURES:  Critical Care performed:   Procedures   MEDICATIONS ORDERED IN ED: Medications - No data to display   IMPRESSION / MDM / ASSESSMENT AND PLAN / ED COURSE  I reviewed  the triage vital signs and the nursing notes.   Differential diagnosis includes, but is not limited to, viral URI, influenza, COVID, and elevated blood pressure versus hypertension.  71 year old male presents to the ED with complaint of headache, cough and congestion for the last 3 days.  He is unaware of any fever and denies chills.  Patient has been taking Mucinex over-the-counter without any relief.  Patient is a former smoker and has a history of asthma and pancreatitis.  Respiratory swab was reviewed and patient is negative for influenza and COVID.  Patient was also made aware that his blood pressure was elevated both in triage and prior to discharge.  He will follow-up with his PCP at the [facility] in [place] for evaluation of his elevated blood pressure to see if this is due to the over-the-counter medications that he has been taking for his congestion versus hypertension.  A prescription for Phenergan DM was written for congestion and cough as needed.  He is encouraged to drink fluids  and take Tylenol or ibuprofen as needed.        FINAL CLINICAL IMPRESSION(S) / ED DIAGNOSES   Final diagnoses:  Viral URI with cough  Elevated blood pressure reading     Rx / DC Orders   ED Discharge Orders          Ordered    promethazine-dextromethorphan (PROMETHAZINE-DM) 6.25-15 MG/5ML syrup  4 times daily PRN,   Status:  Discontinued        07/17/21 1146    promethazine-dextromethorphan (PROMETHAZINE-DM) 6.25-15 MG/5ML syrup  4 times daily PRN        07/17/21 1147             Note:  This document was prepared using Dragon voice recognition software and may include unintentional dictation errors.   Tommi Rumps, PA-C 07/17/21 1153    Arnaldo Natal, MD 07/17/21 1921

## 2021-07-17 NOTE — ED Triage Notes (Signed)
Pt here with congestion 3 days. Pt having cough and headache. Pt in NAD in triage.

## 2021-07-17 NOTE — ED Notes (Signed)
See triage note  presents with headache and nasal congestion  sxs' started couple of days ago   afebrile on arrival

## 2021-07-17 NOTE — Discharge Instructions (Addendum)
Follow-up with your primary care provider if any continued problems or not improving.  A prescription was sent to your pharmacy for congestion and cough.  Also your blood pressure initially in the emergency department was 154/101 and the second time it was taken was 147/105.  This will need to be further evaluated by your doctor at the [facility] to see if you need to be treated for hypertension with medication.  The medication that was sent to the pharmacy should not be taken if you are driving or operating machinery.

## 2022-01-23 ENCOUNTER — Emergency Department
Admission: EM | Admit: 2022-01-23 | Discharge: 2022-01-23 | Disposition: A | Discharge status: Home / Self Care | Payer: No Typology Code available for payment source | Attending: Emergency Medicine | Admitting: Emergency Medicine

## 2022-01-23 ENCOUNTER — Encounter: Payer: Self-pay | Admitting: Emergency Medicine

## 2022-01-23 ENCOUNTER — Other Ambulatory Visit: Payer: Self-pay

## 2022-01-23 DIAGNOSIS — K047 Periapical abscess without sinus: Secondary | ICD-10-CM | POA: Insufficient documentation

## 2022-01-23 DIAGNOSIS — K0889 Other specified disorders of teeth and supporting structures: Secondary | ICD-10-CM | POA: Diagnosis present

## 2022-01-23 DIAGNOSIS — J45909 Unspecified asthma, uncomplicated: Secondary | ICD-10-CM | POA: Diagnosis not present

## 2022-01-23 MED ORDER — OXYCODONE-ACETAMINOPHEN 5-325 MG PO TABS: 1.0000 | ORAL_TABLET | ORAL | 0 refills | Status: DC | PRN

## 2022-01-23 MED ORDER — AMOXICILLIN 875 MG PO TABS: 875.0000 mg | ORAL_TABLET | Freq: Two times a day (BID) | ORAL | 0 refills | Status: DC

## 2022-01-23 NOTE — ED Triage Notes (Signed)
Patient arrives ambulatory to triage c/o right upper dental pain ongoing for a while but worse over the past few days. States a tooth possibly broke.

## 2022-01-23 NOTE — Discharge Instructions (Signed)
Follow up with your regular dentist, take the medication as prescribed Return if worsening

## 2022-01-23 NOTE — ED Provider Notes (Signed)
[facility] Provider Note    Event Date/Time   First MD Initiated Contact with Patient 01/23/22 1802     (approximate)   History   No chief complaint on file.   HPI  Jeremy Frazier is a 71 y.o. male with history of pancreatitis and asthma presents emergency department complaining of right upper tooth pain.  States tooth has become very loose and has an abscess.  No fever or chills.  He does have a regular dentist but did not call them.      Physical Exam   Triage Vital Signs: ED Triage Vitals  Enc Vitals Group     BP 01/23/22 1627 (!) 129/91     Pulse Rate 01/23/22 1627 84     Resp 01/23/22 1627 18     Temp 01/23/22 1627 98.5 F (36.9 C)     Temp Source 01/23/22 1627 Oral     SpO2 01/23/22 1627 94 %     Weight 01/23/22 1629 230 lb (104.3 kg)     Height 01/23/22 1629 6' (1.829 m)     Head Circumference --      Peak Flow --      Pain Score 01/23/22 1628 7     Pain Loc --      Pain Edu? --      Excl. in GC? --     Most recent vital signs: Vitals:   01/23/22 1627  BP: (!) 129/91  Pulse: 84  Resp: 18  Temp: 98.5 F (36.9 C)  SpO2: 94%     General: Awake, no distress.   CV:  Good peripheral perfusion. regular rate and  rhythm Resp:  Normal effort.  Abd:  No distention.   Other:  Right upper canine noted to be loose, swelling noted at the gum   ED Results / Procedures / Treatments   Labs (all labs ordered are listed, but only abnormal results are displayed) Labs Reviewed - No data to display   EKG     RADIOLOGY     PROCEDURES:   Procedures   MEDICATIONS ORDERED IN ED: Medications - No data to display   IMPRESSION / MDM / ASSESSMENT AND PLAN / ED COURSE  I reviewed the triage vital signs and the nursing notes.                              Differential diagnosis includes, but is not limited to, abscess, dental fracture continued  Patient's presentation is most consistent with acute, uncomplicated illness.    Patient's exam is consistent with abscess and loosening tooth due to poor dentition.  He is to follow-up with his regular doctor.  He is to follow-up with his regular dentist.  He was given a prescription for amoxicillin and Percocet.  Return emergency department worsening.  Explained to him that he cannot do dental care and cannot pull his tooth here in the ED.      FINAL CLINICAL IMPRESSION(S) / ED DIAGNOSES   Final diagnoses:  Dental abscess     Rx / DC Orders   ED Discharge Orders          Ordered    amoxicillin (AMOXIL) 875 MG tablet  2 times daily,   Status:  Discontinued        01/23/22 1809    oxyCODONE-acetaminophen (PERCOCET) 5-325 MG tablet  Every 4 hours PRN        01/23/22 1809  amoxicillin (AMOXIL) 875 MG tablet  2 times daily        01/23/22 1810             Note:  This document was prepared using Dragon voice recognition software and may include unintentional dictation errors.    Faythe Ghee, PA-C 01/23/22 1813    Arnaldo Natal, MD 01/23/22 Serena Croissant

## 2022-06-21 ENCOUNTER — Emergency Department: Payer: No Typology Code available for payment source

## 2022-06-21 ENCOUNTER — Encounter: Payer: Self-pay | Admitting: Emergency Medicine

## 2022-06-21 ENCOUNTER — Other Ambulatory Visit: Payer: Self-pay

## 2022-06-21 ENCOUNTER — Emergency Department
Admission: EM | Admit: 2022-06-21 | Discharge: 2022-06-21 | Disposition: A | Discharge status: Home / Self Care | Payer: No Typology Code available for payment source | Attending: Emergency Medicine | Admitting: Emergency Medicine

## 2022-06-21 DIAGNOSIS — Z1152 Encounter for screening for COVID-19: Secondary | ICD-10-CM | POA: Diagnosis not present

## 2022-06-21 DIAGNOSIS — R059 Cough, unspecified: Secondary | ICD-10-CM | POA: Diagnosis present

## 2022-06-21 DIAGNOSIS — J45909 Unspecified asthma, uncomplicated: Secondary | ICD-10-CM | POA: Insufficient documentation

## 2022-06-21 DIAGNOSIS — J210 Acute bronchiolitis due to respiratory syncytial virus: Secondary | ICD-10-CM | POA: Diagnosis not present

## 2022-06-21 LAB — RESP PANEL BY RT-PCR (RSV, FLU A&B, COVID)  RVPGX2
Influenza A by PCR: NEGATIVE
Influenza B by PCR: NEGATIVE
Resp Syncytial Virus by PCR: POSITIVE — AB
SARS Coronavirus 2 by RT PCR: NEGATIVE

## 2022-06-21 MED ORDER — IPRATROPIUM-ALBUTEROL 0.5-2.5 (3) MG/3ML IN SOLN: 3.0000 mL | RESPIRATORY_TRACT | 3 refills | Status: DC | PRN

## 2022-06-21 MED ORDER — IPRATROPIUM-ALBUTEROL 0.5-2.5 (3) MG/3ML IN SOLN
3.0000 mL | Freq: Once | RESPIRATORY_TRACT | Status: AC
  Administered 2022-06-21: 3 mL via RESPIRATORY_TRACT
  Filled 2022-06-21: qty 3

## 2022-06-21 MED ORDER — PSEUDOEPH-BROMPHEN-DM 30-2-10 MG/5ML PO SYRP: 5.0000 mL | ORAL_SOLUTION | Freq: Four times a day (QID) | ORAL | 0 refills | Status: DC | PRN

## 2022-06-21 NOTE — ED Provider Notes (Signed)
[facility] Provider Note    Event Date/Time   First MD Initiated Contact with Patient 06/21/22 1441     (approximate)   History   Cough   HPI  Jeremy Frazier is a 72 y.o. male with history of asthma and pancreatitis presents emergency department with a cough and congestion for the last few days.  Feels like he needs to cough something up but nothing will come up.  Has had more wheezing than normal.  Does use an albuterol inhaler.  Denies chest pain.      Physical Exam   Triage Vital Signs: ED Triage Vitals  Enc Vitals Group     BP 06/21/22 1349 (!) 155/106     Pulse Rate 06/21/22 1349 (!) 124     Resp 06/21/22 1349 19     Temp 06/21/22 1349 98.2 F (36.8 C)     Temp Source 06/21/22 1349 Oral     SpO2 06/21/22 1349 99 %     Weight 06/21/22 1351 220 lb (99.8 kg)     Height 06/21/22 1446 6' (1.829 m)     Head Circumference --      Peak Flow --      Pain Score 06/21/22 1351 0     Pain Loc --      Pain Edu? --      Excl. in [place]? --     Most recent vital signs: Vitals:   06/21/22 1349  BP: (!) 155/106  Pulse: (!) 124  Resp: 19  Temp: 98.2 F (36.8 C)  SpO2: 99%     General: Awake, no distress.   CV:  Good peripheral perfusion. regular rate and  rhythm Resp:  Normal effort. Lungs with wheezing bilaterally Abd:  No distention.   Other:      ED Results / Procedures / Treatments   Labs (all labs ordered are listed, but only abnormal results are displayed) Labs Reviewed  RESP PANEL BY RT-PCR (RSV, FLU A&B, COVID)  [id number] - Abnormal; Notable for the following components:      Result Value   Resp Syncytial Virus by PCR POSITIVE (*)    All other components within normal limits     EKG     RADIOLOGY Chest x-ray    PROCEDURES:   Procedures   MEDICATIONS ORDERED IN ED: Medications  ipratropium-albuterol (DUONEB) 0.5-2.5 (3) MG/3ML nebulizer solution 3 mL (has no administration in time range)     IMPRESSION / MDM /  ASSESSMENT AND PLAN / ED COURSE  I reviewed the triage vital signs and the nursing notes.                              Differential diagnosis includes, but is not limited to, COVID, influenza, RSV, CAP  Patient's presentation is most consistent with acute complicated illness / injury requiring diagnostic workup.   Chest x-ray independently reviewed and interpreted by me as being negative for any acute abnormality  Respiratory panel is positive for RSV  Due to the wheezing and patient's history of asthma we will go ahead and give him a DuoNeb treatment.   Patient had relief with the DuoNeb nebulizer treatment, no wheezing is noted on reexamination  I did explain all findings with patient.  He was given a prescription for Bromfed cough syrup, DuoNeb Nebules and a DME nebulizer machine.  He is to follow-up with his regular doctor if not improving in 3  days.  Return emergency department worsening.  He is in agreement treatment plan.  He was discharged in stable condition.   FINAL CLINICAL IMPRESSION(S) / ED DIAGNOSES   Final diagnoses:  RSV (acute bronchiolitis due to respiratory syncytial virus)     Rx / DC Orders   ED Discharge Orders     None        Note:  This document was prepared using Dragon voice recognition software and may include unintentional dictation errors.    Versie Starks, PA-C 06/21/22 1533    Nance Pear, MD 06/22/22 0700

## 2022-06-21 NOTE — Discharge Instructions (Signed)
Use the medications as prescribed.  Follow-up with your regular doctor if not improving then 2 to 3 days.  Return emergency department worsening.

## 2022-06-21 NOTE — ED Triage Notes (Signed)
Pt sts that he has been having congestion with a cough for the last few days. Pt sts that he feels like he needs to cough something up but he is not able to.

## 2022-07-06 ENCOUNTER — Encounter: Payer: Self-pay | Admitting: Emergency Medicine

## 2022-07-06 ENCOUNTER — Other Ambulatory Visit: Payer: Self-pay

## 2022-07-06 ENCOUNTER — Emergency Department
Admission: EM | Admit: 2022-07-06 | Discharge: 2022-07-06 | Disposition: A | Discharge status: Home / Self Care | Payer: No Typology Code available for payment source | Attending: Emergency Medicine | Admitting: Emergency Medicine

## 2022-07-06 ENCOUNTER — Emergency Department: Payer: No Typology Code available for payment source

## 2022-07-06 DIAGNOSIS — R0789 Other chest pain: Secondary | ICD-10-CM | POA: Insufficient documentation

## 2022-07-06 DIAGNOSIS — R200 Anesthesia of skin: Secondary | ICD-10-CM | POA: Diagnosis not present

## 2022-07-06 DIAGNOSIS — M5416 Radiculopathy, lumbar region: Secondary | ICD-10-CM | POA: Diagnosis not present

## 2022-07-06 DIAGNOSIS — R079 Chest pain, unspecified: Secondary | ICD-10-CM

## 2022-07-06 DIAGNOSIS — E871 Hypo-osmolality and hyponatremia: Secondary | ICD-10-CM | POA: Diagnosis not present

## 2022-07-06 DIAGNOSIS — J45909 Unspecified asthma, uncomplicated: Secondary | ICD-10-CM | POA: Diagnosis not present

## 2022-07-06 LAB — CBC
HCT: 41.1 % (ref 39.0–52.0)
Hemoglobin: 13.2 g/dL (ref 13.0–17.0)
MCH: 30.1 pg (ref 26.0–34.0)
MCHC: 32.1 g/dL (ref 30.0–36.0)
MCV: 93.8 fL (ref 80.0–100.0)
Platelets: 315 10*3/uL (ref 150–400)
RBC: 4.38 MIL/uL (ref 4.22–5.81)
RDW: 11.9 % (ref 11.5–15.5)
WBC: 5.6 10*3/uL (ref 4.0–10.5)
nRBC: 0 % (ref 0.0–0.2)

## 2022-07-06 LAB — BASIC METABOLIC PANEL
Anion gap: 4 — ABNORMAL LOW (ref 5–15)
BUN: 14 mg/dL (ref 8–23)
CO2: 24 mmol/L (ref 22–32)
Calcium: 8.3 mg/dL — ABNORMAL LOW (ref 8.9–10.3)
Chloride: 104 mmol/L (ref 98–111)
Creatinine, Ser: 1.38 mg/dL — ABNORMAL HIGH (ref 0.61–1.24)
GFR, Estimated: 55 mL/min — ABNORMAL LOW (ref >60)
Glucose, Bld: 105 mg/dL — ABNORMAL HIGH (ref 70–99)
Potassium: 3.4 mmol/L — ABNORMAL LOW (ref 3.5–5.1)
Sodium: 132 mmol/L — ABNORMAL LOW (ref 135–145)

## 2022-07-06 LAB — TROPONIN I (HIGH SENSITIVITY)
Troponin I (High Sensitivity): 3 ng/L (ref <18)
Troponin I (High Sensitivity): 3 ng/L (ref <18)

## 2022-07-06 LAB — D-DIMER, QUANTITATIVE: D-Dimer, Quant: <0.27 ug/mL-FEU (ref 0.00–0.50)

## 2022-07-06 MED ORDER — SODIUM CHLORIDE 0.9 % IV BOLUS
500.0000 mL | Freq: Once | INTRAVENOUS | Status: AC
  Administered 2022-07-06: 500 mL via INTRAVENOUS

## 2022-07-06 MED ORDER — METHOCARBAMOL 500 MG PO TABS: 500.0000 mg | ORAL_TABLET | Freq: Three times a day (TID) | ORAL | 0 refills | Status: AC

## 2022-07-06 MED ORDER — MELOXICAM 15 MG PO TABS: 15.0000 mg | ORAL_TABLET | Freq: Every day | ORAL | 11 refills | Status: AC

## 2022-07-06 MED ORDER — PREDNISONE 10 MG PO TABS: ORAL_TABLET | ORAL | 0 refills | Status: AC

## 2022-07-06 NOTE — Discharge Instructions (Addendum)
-  Based on examination and your CT scan, there is not appear to be any evidence of stroke or any serious or life-threatening conditions.  I suspect that your facial numbness may be related to an inflammation of your nerves from your recent viral infection (Bell palsy).  I suspect that it will go away on its own.  If it continues despite her treatment, please follow-up with the neurologist listed on this page (Dr. Manuella Ghazi)  -In regards to the numbness that you feel in your right arm, this could potentially be from a pinched nerve.  Your CT scan did show some degenerative disc disease.  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  Please follow-up with the neurosurgeon listed on this page (Dr. Izora Ribas)  -In regards to your chest pain, we are unsure exactly what it is, however it does not appear to be anything serious or life-threatening based on your scans and your lab workup.  I suspect that this will resolve on its own.  -Return to the emergency department anytime if you begin to experience any new or worsening symptoms.

## 2022-07-06 NOTE — ED Provider Notes (Signed)
[facility] Provider Note    Event Date/Time   First MD Initiated Contact with Patient 07/06/22 1433     (approximate)   History   Chief Complaint Chest Pain   HPI Jeremy Frazier is a 72 y.o. male, history of asthma, pancreatitis, presents to the emergency department for evaluation of pain/numbness.  Patient states that for the past few days, he has felt numbness on the right side of his face, as well as the right upper extremity (particularly in the index finger), and right foot.  He states that this been going on for 3 days.  In addition, he has had left-sided chest pain/tightness as well.  He did have a RSV infection couple weeks ago and is unsure if this is related.  Additionally, he states that he has been getting chiropractor adjustments frequently over the past several weeks, including neck adjustments and lumbar spine adjustments.  Again, he is unsure if this is related.  Denies fever/chills, motor deficits, shortness of breath, abdominal pain, flank pain, nausea vomiting, diarrhea, urinary symptoms, weakness, rash/lesions, vertigo, vision change, hearing changes, or dizziness/lightheadedness.  History Limitations: No limitations.        Physical Exam  Triage Vital Signs: ED Triage Vitals  Enc Vitals Group     BP 07/06/22 1317 (!) 146/100     Pulse Rate 07/06/22 1317 100     Resp 07/06/22 1317 18     Temp 07/06/22 1317 98.2 F (36.8 C)     Temp Source 07/06/22 1317 Oral     SpO2 07/06/22 1317 98 %     Weight 07/06/22 1319 220 lb (99.8 kg)     Height 07/06/22 1319 6' (1.829 m)     Head Circumference --      Peak Flow --      Pain Score 07/06/22 1319 7     Pain Loc --      Pain Edu? --      Excl. in GC? --     Most recent vital signs: Vitals:   07/06/22 1317 07/06/22 1811  BP: (!) 146/100 (!) 145/94  Pulse: 100 75  Resp: 18 15  Temp: 98.2 F (36.8 C) (!) 97 F (36.1 C)  SpO2: 98% 98%    General: Awake, NAD.  Skin: Warm, dry. No  rashes or lesions.  Eyes: PERRL. Conjunctivae normal.  CV: Good peripheral perfusion.  S1 and S2 present.  No murmurs, rubs, or gallops. Resp: Normal effort.  Lung sounds are clear bilaterally in the apices and bases.  No chest wall tenderness. Abd: Soft, non-tender. No distention.  Neuro: At baseline. No gross neurological deficits.  Cranial nerves II through XII intact.  5/5 strength sensation in both upper and lower extremities.  Slightly decreased sensation on the right side of the face, though no motor deficits.  No facial droop.  He is able to ambulate on his own without difficulty. Musculoskeletal: Normal ROM of all extremities.  Physical Exam    ED Results / Procedures / Treatments  Labs (all labs ordered are listed, but only abnormal results are displayed) Labs Reviewed  BASIC METABOLIC PANEL - Abnormal; Notable for the following components:      Result Value   Sodium 132 (*)    Potassium 3.4 (*)    Glucose, Bld 105 (*)    Creatinine, Ser 1.38 (*)    Calcium 8.3 (*)    GFR, Estimated 55 (*)    Anion gap 4 (*)  All other components within normal limits  CBC  D-DIMER, QUANTITATIVE  TROPONIN I (HIGH SENSITIVITY)  TROPONIN I (HIGH SENSITIVITY)     EKG Sinus rhythm, rate of 90, occasional PVC see, no ST segment changes, normal QRS, no QT prolongation, no significant axis deviations.    RADIOLOGY  ED Provider Interpretation: I personally reviewed and interpreted these images.  CT head shows no acute intracranial abnormalities.  CT cervical spine shows no acute findings, but does show some cervical degenerative disc disease.  Chest x-ray shows no acute findings.  CT lumbar shows severe narrowing of bilateral lateral recesses and moderate overall spinal canal narrowing at L4/L5.  Small disc bulge at L2/L3.  CT Lumbar Spine Wo Contrast  Result Date: 07/06/2022 CLINICAL DATA:  Lumbar radiculopathy EXAM: CT LUMBAR SPINE WITHOUT CONTRAST TECHNIQUE: Multidetector CT imaging  of the lumbar spine was performed without intravenous contrast administration. Multiplanar CT image reconstructions were also generated. RADIATION DOSE REDUCTION: This exam was performed according to the departmental dose-optimization program which includes automated exposure control, adjustment of the mA and/or kV according to patient size and/or use of iterative reconstruction technique. COMPARISON:  None Available. FINDINGS: Segmentation: 5 lumbar type vertebrae. Alignment: Trace anterolisthesis of L4 on L5. Trace retrolisthesis of L5 on S1. Vertebrae: No acute fracture or focal pathologic process. There are multilevel degenerative endplate changes. Paraspinal and other soft tissues: Hypodense lesion the anterior margin of the upper pole of the right kidney is favored to represent renal cysts requiring no further follow-up. There is a calcific density along the interpolar right kidney favored to represent a small right-sided renal stone Disc levels: T12-L1: No disc bulge. Mild bilateral facet degenerative change. No spinal canal stenosis. Moderate left-sided neural foraminal stenosis. L1-L2: Mild bilateral facet degenerative change. No significant disc bulge. Ligamentum flavum hypertrophy. Minimal disc bulge. No significant spinal canal stenosis. Moderate left-sided neural foraminal stenosis. L2-L3: Small disc bulge. Mild bilateral facet degenerative change. Ligamentum flavum hypertrophy. Mild spinal canal narrowing. Mild-to-moderate left-sided neural foraminal stenosis. L3-L4: Vacuum disc phenomenon. Eccentric left disc bulge. Mild bilateral facet degenerative change. Mild overall spinal canal narrowing. Moderate to severe left-sided neural foraminal stenosis. L4-L5: Vacuum disc phenomenon. Moderate to severe right-sided facet degenerative change. Circumferential disc bulge. Ligamentum flavum hypertrophy. Moderate spinal canal narrowing. Severe narrowing of the bilateral lateral recesses. Severe right and mild  left neural foraminal narrowing. L5-S1: Vacuum disc phenomenon. Moderate to severe right-sided facet degenerative change. Circumferential disc bulge. No bony spinal narrowing. Severe right and moderate left neural foraminal narrowing. IMPRESSION: 1. At L4-L5 there is severe narrowing of the bilateral lateral recesses and moderate overall spinal canal narrowing. 2. Severe neuroforaminal narrowing at L4-L5 (right) and L5-S1 (right). 3. Additional degenerative changes, as above. Electronically Signed   By: Marin Roberts M.D.   On: 07/06/2022 15:55   CT Head Wo Contrast  Result Date: 07/06/2022 CLINICAL DATA:  Headache. Right-side of facial numbness and numbness in both hands and right foot. EXAM: CT HEAD WITHOUT CONTRAST CT CERVICAL SPINE WITHOUT CONTRAST TECHNIQUE: Multidetector CT imaging of the head and cervical spine was performed following the standard protocol without intravenous contrast. Multiplanar CT image reconstructions of the cervical spine were also generated. RADIATION DOSE REDUCTION: This exam was performed according to the departmental dose-optimization program which includes automated exposure control, adjustment of the mA and/or kV according to patient size and/or use of iterative reconstruction technique. COMPARISON:  04/11/2016 FINDINGS: CT HEAD FINDINGS Brain: No evidence of acute infarction, hemorrhage, hydrocephalus, extra-axial collection or mass  lesion/mass effect. Small lacunar infarct identified within the right posterior basal ganglia, image 114/2. Unchanged from the previous exam. Vascular: No hyperdense vessel or unexpected calcification. Skull: Normal. Negative for fracture or focal lesion. Sinuses/Orbits: Bilateral mucosal thickening with air-fluid levels noted involving both maxillary sinuses. Other: None CT CERVICAL SPINE FINDINGS Alignment: No signs of acute posttraumatic malalignment of the cervical spine. Reversal of normal cervical lordosis is centered around the C4-5 level  and may reflect muscle spasm or patient positioning. Skull base and vertebrae: No acute fracture. No primary bone lesion or focal pathologic process. Soft tissues and spinal canal: No prevertebral fluid or swelling. No visible canal hematoma. Disc levels: Moderate to advanced disc space narrowing and endplate spurring noted C5-6, C6-7 and C7-T1. Upper chest: Negative. Other: None IMPRESSION: 1. No acute intracranial abnormality. 2. No evidence for acute posttraumatic malalignment of the cervical spine. 3. Cervical degenerative disc disease. 4. Bilateral maxillary sinus inflammation with air-fluid levels. Correlate for any clinical signs or symptoms of acute sinusitis. Electronically Signed   By: Kerby Moors M.D.   On: 07/06/2022 15:54   CT Cervical Spine Wo Contrast  Result Date: 07/06/2022 CLINICAL DATA:  Headache. Right-side of facial numbness and numbness in both hands and right foot. EXAM: CT HEAD WITHOUT CONTRAST CT CERVICAL SPINE WITHOUT CONTRAST TECHNIQUE: Multidetector CT imaging of the head and cervical spine was performed following the standard protocol without intravenous contrast. Multiplanar CT image reconstructions of the cervical spine were also generated. RADIATION DOSE REDUCTION: This exam was performed according to the departmental dose-optimization program which includes automated exposure control, adjustment of the mA and/or kV according to patient size and/or use of iterative reconstruction technique. COMPARISON:  04/11/2016 FINDINGS: CT HEAD FINDINGS Brain: No evidence of acute infarction, hemorrhage, hydrocephalus, extra-axial collection or mass lesion/mass effect. Small lacunar infarct identified within the right posterior basal ganglia, image 114/2. Unchanged from the previous exam. Vascular: No hyperdense vessel or unexpected calcification. Skull: Normal. Negative for fracture or focal lesion. Sinuses/Orbits: Bilateral mucosal thickening with air-fluid levels noted involving both  maxillary sinuses. Other: None CT CERVICAL SPINE FINDINGS Alignment: No signs of acute posttraumatic malalignment of the cervical spine. Reversal of normal cervical lordosis is centered around the C4-5 level and may reflect muscle spasm or patient positioning. Skull base and vertebrae: No acute fracture. No primary bone lesion or focal pathologic process. Soft tissues and spinal canal: No prevertebral fluid or swelling. No visible canal hematoma. Disc levels: Moderate to advanced disc space narrowing and endplate spurring noted C5-6, C6-7 and C7-T1. Upper chest: Negative. Other: None IMPRESSION: 1. No acute intracranial abnormality. 2. No evidence for acute posttraumatic malalignment of the cervical spine. 3. Cervical degenerative disc disease. 4. Bilateral maxillary sinus inflammation with air-fluid levels. Correlate for any clinical signs or symptoms of acute sinusitis. Electronically Signed   By: Kerby Moors M.D.   On: 07/06/2022 15:54   DG Chest 2 View  Result Date: 07/06/2022 CLINICAL DATA:  Chest pain EXAM: CHEST - 2 VIEW COMPARISON:  CXR 06/21/22 FINDINGS: No pleural effusion. No pneumothorax. No focal airspace opacity. Normal cardiac and mediastinal contours. No displaced rib fractures. Vertebral body heights are unchanged from prior exam with redemonstrated focal kyphosis centered at the approximate T5 vertebral body. Visualized upper is unremarkable. IMPRESSION: No focal airspace opacity. Electronically Signed   By: Marin Roberts M.D.   On: 07/06/2022 13:46    PROCEDURES:  Critical Care performed: N/A.  Procedures    MEDICATIONS ORDERED IN ED: Medications  sodium  chloride 0.9 % bolus 500 mL (0 mLs Intravenous Stopped 07/06/22 1806)     IMPRESSION / MDM / ASSESSMENT AND PLAN / ED COURSE  I reviewed the triage vital signs and the nursing notes.                              Differential diagnosis includes, but is not limited to, CVA, cervical radiculopathy, lumbar radiculopathy, ACS,  myocarditis, pericarditis, pneumonia, viral syndrome, Bell palsy, TIA.  ED Course Patient appears well, vitals within normal limits.  NAD.  CT shows no leukocytosis or anemia.  No thrombocytopenia.  BMP shows elevated creatinine 1.38, consistent with prior values.  Mild hyponatremia present at 132.  No other significant electrolyte abnormalities.  Initial troponin 3, second troponin 3.  D-dimer unremarkable.  Less than 0.27.  Assessment/Plan Patient presents with several symptoms, including right-sided facial numbness, left-sided chest pain, right upper extremity numbness, and right lower extremity numbness x 1 week.  In regards to his chest pain, his EKG is unremarkable.  Troponins do not show any evidence of ACS, myocarditis, or pericarditis.  He does not have any significant risk factors for pulmonary embolism.  D-dimer unremarkable.  Unclear etiology, but unlikely to be any serious or life-threatening pathology.  In regards to his paresthesias, he does not have any gross neurological deficits on exam.  Sensation is intact and he does not have any significant motor deficits.  Normal strength and sensation in all extremities.  His CT lumbar shows severe narrowing of bilateral lateral recesses and moderate overall spinal canal narrowing at L4/L5.  Small disc bulge at L2/L3.  I suspect this is likely the cause of his paresthesias in the right lower extremity.  In regards to his facial numbness and right upper extremity numbness, there does not appear to be any major deficits to make me concerned for CVA.  Given the recent viral infection, it is possible that he has some degree of Bell palsy.  Could potentially also be a cervical radiculopathy.  Will trial a prednisone taper to see if this helps improve his symptoms.  Will additionally trial some muscle relaxers, as well as meloxicam.  Advised him to follow-up with neurology within the next week for reevaluation.  Additionally provide him with a  referral to neurosurgery in regards to his lumbar spine pathology.  He was amenable to this.  Will discharge.  Considered admission for this patient, but given his stable presentation and unremarkable workup, is unlikely benefit from admission.  Provided the patient with anticipatory guidance, return precautions, and educational material. Encouraged the patient to return to the emergency department at any time if they begin to experience any new or worsening symptoms. Patient expressed understanding and agreed with the plan.   Patient's presentation is most consistent with acute complicated illness / injury requiring diagnostic workup.       FINAL CLINICAL IMPRESSION(S) / ED DIAGNOSES   Final diagnoses:  Nonspecific chest pain  Facial numbness  Lumbar radiculopathy     Rx / DC Orders   ED Discharge Orders          Ordered    predniSONE (DELTASONE) 10 MG tablet  Q breakfast        07/06/22 1753    meloxicam (MOBIC) 15 MG tablet  Daily        07/06/22 1753    methocarbamol (ROBAXIN) 500 MG tablet  3 times daily  07/06/22 1753             Note:  This document was prepared using Dragon voice recognition software and may include unintentional dictation errors.   Teodoro Spray, [place] 07/06/22 Tresa Moore    Blake Divine, MD 07/06/22 2252

## 2022-07-06 NOTE — ED Triage Notes (Signed)
Both hands, right foot, right side of face has been having numbness for the past 3 days, pt states today the left side of his chest started to get tight

## 2022-07-07 ENCOUNTER — Telehealth: Payer: Self-pay

## 2022-07-07 NOTE — Telephone Encounter (Signed)
Jeremy Maw, MD  Linzie Collin, RN Hey-  [facility]'s a [facility] patient.  We can definitely see him, but need the appropriate referral for community care.  If he wants to see [place] locally, he'll have to get that referral through his Frank MD.  Ardyth Gal

## 2022-07-07 NOTE — Telephone Encounter (Signed)
Unable to leave message, voice mail is not set up.

## 2022-07-08 NOTE — Telephone Encounter (Signed)
Unable to leave message, voice mail is not set up.

## 2022-07-12 NOTE — Telephone Encounter (Signed)
Unable to leave message, voice mail is not set up.

## 2022-12-23 ENCOUNTER — Emergency Department: Payer: No Typology Code available for payment source

## 2022-12-23 ENCOUNTER — Other Ambulatory Visit: Payer: Self-pay

## 2022-12-23 ENCOUNTER — Emergency Department
Admission: EM | Admit: 2022-12-23 | Discharge: 2022-12-23 | Disposition: A | Discharge status: Home / Self Care | Payer: No Typology Code available for payment source | Attending: Emergency Medicine | Admitting: Emergency Medicine

## 2022-12-23 DIAGNOSIS — Z1152 Encounter for screening for COVID-19: Secondary | ICD-10-CM | POA: Diagnosis not present

## 2022-12-23 DIAGNOSIS — J40 Bronchitis, not specified as acute or chronic: Secondary | ICD-10-CM | POA: Insufficient documentation

## 2022-12-23 DIAGNOSIS — R0602 Shortness of breath: Secondary | ICD-10-CM | POA: Diagnosis present

## 2022-12-23 DIAGNOSIS — J069 Acute upper respiratory infection, unspecified: Secondary | ICD-10-CM | POA: Insufficient documentation

## 2022-12-23 DIAGNOSIS — R0600 Dyspnea, unspecified: Secondary | ICD-10-CM | POA: Insufficient documentation

## 2022-12-23 DIAGNOSIS — J45909 Unspecified asthma, uncomplicated: Secondary | ICD-10-CM | POA: Insufficient documentation

## 2022-12-23 LAB — COMPREHENSIVE METABOLIC PANEL
ALT: 19 U/L (ref 0–44)
AST: 22 U/L (ref 15–41)
Albumin: 4.6 g/dL (ref 3.5–5.0)
Alkaline Phosphatase: 69 U/L (ref 38–126)
Anion gap: 6 (ref 5–15)
BUN: 14 mg/dL (ref 8–23)
CO2: 22 mmol/L (ref 22–32)
Calcium: 9.2 mg/dL (ref 8.9–10.3)
Chloride: 106 mmol/L (ref 98–111)
Creatinine, Ser: 1.47 mg/dL — ABNORMAL HIGH (ref 0.61–1.24)
GFR, Estimated: 51 mL/min — ABNORMAL LOW (ref >60)
Glucose, Bld: 99 mg/dL (ref 70–99)
Potassium: 4.1 mmol/L (ref 3.5–5.1)
Sodium: 134 mmol/L — ABNORMAL LOW (ref 135–145)
Total Bilirubin: 1.2 mg/dL (ref 0.3–1.2)
Total Protein: 8.3 g/dL — ABNORMAL HIGH (ref 6.5–8.1)

## 2022-12-23 LAB — CBC WITH DIFFERENTIAL/PLATELET
Abs Immature Granulocytes: 0.01 10*3/uL (ref 0.00–0.07)
Basophils Absolute: 0.1 10*3/uL (ref 0.0–0.1)
Basophils Relative: 1 %
Eosinophils Absolute: 0.9 10*3/uL — ABNORMAL HIGH (ref 0.0–0.5)
Eosinophils Relative: 16 %
HCT: 40 % (ref 39.0–52.0)
Hemoglobin: 13.7 g/dL (ref 13.0–17.0)
Immature Granulocytes: 0 %
Lymphocytes Relative: 26 %
Lymphs Abs: 1.4 10*3/uL (ref 0.7–4.0)
MCH: 30.4 pg (ref 26.0–34.0)
MCHC: 34.3 g/dL (ref 30.0–36.0)
MCV: 88.9 fL (ref 80.0–100.0)
Monocytes Absolute: 0.7 10*3/uL (ref 0.1–1.0)
Monocytes Relative: 12 %
Neutro Abs: 2.4 10*3/uL (ref 1.7–7.7)
Neutrophils Relative %: 45 %
Platelets: 304 10*3/uL (ref 150–400)
RBC: 4.5 MIL/uL (ref 4.22–5.81)
RDW: 11.8 % (ref 11.5–15.5)
WBC: 5.4 10*3/uL (ref 4.0–10.5)
nRBC: 0 % (ref 0.0–0.2)

## 2022-12-23 LAB — SARS CORONAVIRUS 2 BY RT PCR: SARS Coronavirus 2 by RT PCR: NEGATIVE

## 2022-12-23 MED ORDER — AZITHROMYCIN 500 MG PO TABS
500.0000 mg | ORAL_TABLET | Freq: Once | ORAL | Status: AC
  Administered 2022-12-23: 500 mg via ORAL
  Filled 2022-12-23: qty 1

## 2022-12-23 MED ORDER — ALBUTEROL SULFATE HFA 108 (90 BASE) MCG/ACT IN AERS
2.0000 | INHALATION_SPRAY | RESPIRATORY_TRACT | Status: DC | PRN
  Filled 2022-12-23: qty 6.7

## 2022-12-23 MED ORDER — AZITHROMYCIN 250 MG PO TABS: ORAL_TABLET | ORAL | 0 refills | Status: AC

## 2022-12-23 MED ORDER — IPRATROPIUM-ALBUTEROL 0.5-2.5 (3) MG/3ML IN SOLN
3.0000 mL | Freq: Once | RESPIRATORY_TRACT | Status: AC
  Administered 2022-12-23: 3 mL via RESPIRATORY_TRACT
  Filled 2022-12-23: qty 3

## 2022-12-23 NOTE — ED Provider Notes (Signed)
[facility] Provider Note    Event Date/Time   First MD Initiated Contact with Patient 12/23/22 1554     (approximate)  History   Chief Complaint: Shortness of Breath  HPI  Jeremy Frazier is a 72 y.o. male with a past medical history of asthma who presents to the emergency department for 3 days of shortness of breath and congestion.  According to the patient for the past 3 days he has been feeling somewhat short of breath with chest congestion cough but no sputum production.  No known fever.  Physical Exam   Triage Vital Signs: ED Triage Vitals  Encounter Vitals Group     BP 12/23/22 1411 (!) 117/94     Systolic BP Percentile --      Diastolic BP Percentile --      Pulse Rate 12/23/22 1411 96     Resp 12/23/22 1411 19     Temp 12/23/22 1411 98.7 F (37.1 C)     Temp Source 12/23/22 1411 Oral     SpO2 12/23/22 1411 97 %     Weight 12/23/22 1412 220 lb (99.8 kg)     Height 12/23/22 1412 6' (1.829 m)     Head Circumference --      Peak Flow --      Pain Score 12/23/22 1411 0     Pain Loc --      Pain Education --      Exclude from Growth Chart --     Most recent vital signs: Vitals:   12/23/22 1411  BP: (!) 117/94  Pulse: 96  Resp: 19  Temp: 98.7 F (37.1 C)  SpO2: 97%    General: Awake, no distress.  CV:  Good peripheral perfusion.  Regular rate and rhythm  Resp:  Normal effort.  Equal breath sounds bilaterally.  Mild expiratory wheeze bilaterally.  Occasional cough.  Wheeze largely clears with coughing. Abd:  No distention.  Soft, nontender.  No rebound or guarding.  ED Results / Procedures / Treatments   EKG  EKG viewed and interpreted by myself shows a normal sinus rhythm 81 bpm with a narrow QRS, normal axis, normal intervals, no concerning ST changes.  RADIOLOGY  I have reviewed and interpreted the chest x-ray images.  No consolidation seen on my evaluation. Radiology is read the chest x-ray is negative.   MEDICATIONS  ORDERED IN ED: Medications  albuterol (VENTOLIN HFA) 108 (90 Base) MCG/ACT inhaler 2 puff (has no administration in time range)  ipratropium-albuterol (DUONEB) 0.5-2.5 (3) MG/3ML nebulizer solution 3 mL (has no administration in time range)  ipratropium-albuterol (DUONEB) 0.5-2.5 (3) MG/3ML nebulizer solution 3 mL (has no administration in time range)     IMPRESSION / MDM / ASSESSMENT AND PLAN / ED COURSE  I reviewed the triage vital signs and the nursing notes.  Patient's presentation is most consistent with acute presentation with potential threat to life or bodily function.  Patient presents to the emergency department for 2 to 3 days of worsening cough congestion states some shortness of breath at times.  Overall the patient appears well, slight expiratory wheeze on exam and occasional cough during evaluation.  We will treat with 2 DuoNebs in the emergency department.  Patient's lab work shows a reassuring CBC with a normal white blood cell count, reassuring chemistry.  Patient's chest x-ray is clear.  Given the patient's 3 days of cough and congestion we will test for COVID as well.  If the patient's COVID  is negative anticipate discharge home on Zithromax to cover for acute bronchitis and the patient will follow-up with his doctor.  Patient agreeable to plan of care.  COVID test is negative.  Workup is reassuring.  Patient is feeling better after breathing treatments.  Will discharge with a short course of antibiotics to cover for bronchitis.  Patient follow-up with his doctor.  FINAL CLINICAL IMPRESSION(S) / ED DIAGNOSES   Dyspnea URI   Note:  This document was prepared using Dragon voice recognition software and may include unintentional dictation errors.   Minna Antis, MD 12/23/22 [phone]

## 2022-12-23 NOTE — ED Triage Notes (Addendum)
Pt arrives via POV with c/o shortness of breath x several days, reports hx asthma. Pt reports he has been using inhaler at home and it hasn't helped. Pt not in obvious distress in triage. Pt also reports dry cough, denies sick contacts.

## 2023-03-07 ENCOUNTER — Emergency Department: Payer: No Typology Code available for payment source

## 2023-03-07 ENCOUNTER — Emergency Department
Admission: EM | Admit: 2023-03-07 | Discharge: 2023-03-07 | Disposition: A | Discharge status: Home / Self Care | Payer: No Typology Code available for payment source | Attending: Emergency Medicine | Admitting: Emergency Medicine

## 2023-03-07 ENCOUNTER — Other Ambulatory Visit: Payer: Self-pay

## 2023-03-07 DIAGNOSIS — J45909 Unspecified asthma, uncomplicated: Secondary | ICD-10-CM | POA: Diagnosis not present

## 2023-03-07 DIAGNOSIS — J069 Acute upper respiratory infection, unspecified: Secondary | ICD-10-CM | POA: Insufficient documentation

## 2023-03-07 DIAGNOSIS — Z1152 Encounter for screening for COVID-19: Secondary | ICD-10-CM | POA: Diagnosis not present

## 2023-03-07 DIAGNOSIS — J988 Other specified respiratory disorders: Secondary | ICD-10-CM

## 2023-03-07 DIAGNOSIS — R062 Wheezing: Secondary | ICD-10-CM | POA: Diagnosis present

## 2023-03-07 LAB — CBC WITH DIFFERENTIAL/PLATELET
Abs Immature Granulocytes: 0.01 10*3/uL (ref 0.00–0.07)
Basophils Absolute: 0.1 10*3/uL (ref 0.0–0.1)
Basophils Relative: 1 %
Eosinophils Absolute: 0.4 10*3/uL (ref 0.0–0.5)
Eosinophils Relative: 7 %
HCT: 41.8 % (ref 39.0–52.0)
Hemoglobin: 13.5 g/dL (ref 13.0–17.0)
Immature Granulocytes: 0 %
Lymphocytes Relative: 27 %
Lymphs Abs: 1.4 10*3/uL (ref 0.7–4.0)
MCH: 29.9 pg (ref 26.0–34.0)
MCHC: 32.3 g/dL (ref 30.0–36.0)
MCV: 92.7 fL (ref 80.0–100.0)
Monocytes Absolute: 0.6 10*3/uL (ref 0.1–1.0)
Monocytes Relative: 12 %
Neutro Abs: 2.8 10*3/uL (ref 1.7–7.7)
Neutrophils Relative %: 53 %
Platelets: 302 10*3/uL (ref 150–400)
RBC: 4.51 MIL/uL (ref 4.22–5.81)
RDW: 11.6 % (ref 11.5–15.5)
WBC: 5.2 10*3/uL (ref 4.0–10.5)
nRBC: 0 % (ref 0.0–0.2)

## 2023-03-07 LAB — COMPREHENSIVE METABOLIC PANEL
ALT: 18 U/L (ref 0–44)
AST: 22 U/L (ref 15–41)
Albumin: 4.6 g/dL (ref 3.5–5.0)
Alkaline Phosphatase: 72 U/L (ref 38–126)
Anion gap: 8 (ref 5–15)
BUN: 18 mg/dL (ref 8–23)
CO2: 25 mmol/L (ref 22–32)
Calcium: 9.2 mg/dL (ref 8.9–10.3)
Chloride: 105 mmol/L (ref 98–111)
Creatinine, Ser: 1.41 mg/dL — ABNORMAL HIGH (ref 0.61–1.24)
GFR, Estimated: 54 mL/min — ABNORMAL LOW (ref >60)
Glucose, Bld: 101 mg/dL — ABNORMAL HIGH (ref 70–99)
Potassium: 4.3 mmol/L (ref 3.5–5.1)
Sodium: 138 mmol/L (ref 135–145)
Total Bilirubin: 0.8 mg/dL (ref 0.3–1.2)
Total Protein: 8 g/dL (ref 6.5–8.1)

## 2023-03-07 LAB — SARS CORONAVIRUS 2 BY RT PCR: SARS Coronavirus 2 by RT PCR: NEGATIVE

## 2023-03-07 LAB — D-DIMER, QUANTITATIVE: D-Dimer, Quant: 0.27 ug{FEU}/mL (ref 0.00–0.50)

## 2023-03-07 LAB — TROPONIN I (HIGH SENSITIVITY): Troponin I (High Sensitivity): <2 ng/L (ref <18)

## 2023-03-07 LAB — BRAIN NATRIURETIC PEPTIDE: B Natriuretic Peptide: 28 pg/mL (ref 0.0–100.0)

## 2023-03-07 MED ORDER — IPRATROPIUM-ALBUTEROL 0.5-2.5 (3) MG/3ML IN SOLN
3.0000 mL | Freq: Once | RESPIRATORY_TRACT | Status: AC
  Administered 2023-03-07: 3 mL via RESPIRATORY_TRACT
  Filled 2023-03-07: qty 3

## 2023-03-07 MED ORDER — METHYLPREDNISOLONE SODIUM SUCC 125 MG IJ SOLR
125.0000 mg | Freq: Once | INTRAMUSCULAR | Status: AC
  Administered 2023-03-07: 125 mg via INTRAVENOUS
  Filled 2023-03-07: qty 2

## 2023-03-07 MED ORDER — AZITHROMYCIN 250 MG PO TABS: ORAL_TABLET | ORAL | 0 refills | Status: AC

## 2023-03-07 MED ORDER — BUDESONIDE-FORMOTEROL FUMARATE 160-4.5 MCG/ACT IN AERO: 2.0000 | INHALATION_SPRAY | Freq: Two times a day (BID) | RESPIRATORY_TRACT | 1 refills | Status: AC

## 2023-03-07 MED ORDER — ALBUTEROL SULFATE (2.5 MG/3ML) 0.083% IN NEBU: 3.0000 mL | INHALATION_SOLUTION | RESPIRATORY_TRACT | Status: DC | PRN

## 2023-03-07 MED ORDER — PREDNISONE 20 MG PO TABS: 40.0000 mg | ORAL_TABLET | Freq: Every day | ORAL | 0 refills | Status: AC

## 2023-03-07 NOTE — ED Provider Notes (Signed)
[facility] Provider Note    Event Date/Time   First MD Initiated Contact with Patient 03/07/23 1551     (approximate)   History   Shortness of Breath and Nasal Congestion   HPI  Quan Cybulski is a 72 y.o. male  with PMHx asthma, pancreatitis, here with SOB. Pt reports over the past 1 week he has had progressively worsening cough, wheezing, and some SOB. He feels like he can't catch his breath, particularly at night and when lying flat. Reports he has been having difficulty catching his breath for the last 24 hours. Reports he has had "bronchitis" and "asthma' similar to this in the past. No chest pain. No fevers, chills. He does feel al fullness in his chest like he needs to "get stuff out" but has not produced much sputum. He does have a family h/o asthma and PEs.       Physical Exam   Triage Vital Signs: ED Triage Vitals  Encounter Vitals Group     BP 03/07/23 1349 (!) 123/95     Systolic BP Percentile --      Diastolic BP Percentile --      Pulse Rate 03/07/23 1349 92     Resp 03/07/23 1349 17     Temp 03/07/23 1349 98.2 F (36.8 C)     Temp Source 03/07/23 1349 Oral     SpO2 03/07/23 1349 96 %     Weight 03/07/23 1347 220 lb (99.8 kg)     Height 03/07/23 1347 6' (1.829 m)     Head Circumference --      Peak Flow --      Pain Score 03/07/23 1347 0     Pain Loc --      Pain Education --      Exclude from Growth Chart --     Most recent vital signs: Vitals:   03/07/23 1700 03/07/23 1753  BP: (!) 131/92   Pulse: (!) 58   Resp: 16   Temp:  98 F (36.7 C)  SpO2: 100%      General: Awake, no distress.  CV:  Good peripheral perfusion. RRR. Resp:  Slight tachypnea, diminished aeration and mild expiratory wheezes. Speaking in full sentences, however. Abd:  No distention. No tenderness. Other:  Trace R>L edema   ED Results / Procedures / Treatments   Labs (all labs ordered are listed, but only abnormal results are displayed) Labs  Reviewed  COMPREHENSIVE METABOLIC PANEL - Abnormal; Notable for the following components:      Result Value   Glucose, Bld 101 (*)    Creatinine, Ser 1.41 (*)    GFR, Estimated 54 (*)    All other components within normal limits  SARS CORONAVIRUS 2 BY RT PCR  D-DIMER, QUANTITATIVE  CBC WITH DIFFERENTIAL/PLATELET  BRAIN NATRIURETIC PEPTIDE  TROPONIN I (HIGH SENSITIVITY)     EKG Normal sinus rhythm, VR 79. PR 200, QRS 74, QTc 426. No acute ST elevations or depressions. No ischemia or infarct.   RADIOLOGY CXR: No acute disease   I also independently reviewed and agree with radiologist interpretations.   PROCEDURES:  Critical Care performed: No  Procedures    MEDICATIONS ORDERED IN ED: Medications  methylPREDNISolone sodium succinate (SOLU-MEDROL) 125 mg/2 mL injection 125 mg (125 mg Intravenous Given 03/07/23 1617)  ipratropium-albuterol (DUONEB) 0.5-2.5 (3) MG/3ML nebulizer solution 3 mL (3 mLs Nebulization Given 03/07/23 1617)  ipratropium-albuterol (DUONEB) 0.5-2.5 (3) MG/3ML nebulizer solution 3 mL (3 mLs Nebulization  Given 03/07/23 1617)  ipratropium-albuterol (DUONEB) 0.5-2.5 (3) MG/3ML nebulizer solution 3 mL (3 mLs Nebulization Given 03/07/23 1617)     IMPRESSION / MDM / ASSESSMENT AND PLAN / ED COURSE  I reviewed the triage vital signs and the nursing notes.                              Differential diagnosis includes, but is not limited to, bronchitis, COPD exacerbation, pneumonia, PE, CHF, anemia, URI  Patient's presentation is most consistent with acute presentation with potential threat to life or bodily function.  The patient is on the cardiac monitor to evaluate for evidence of arrhythmia and/or significant heart rate changes   72 year old male with past medical history of asthma here with cough and shortness of breath.  The patient has what I suspect is acute on chronic asthma/COPD exacerbation with diffuse wheezing.  Chest x-ray is clear.  He has no  evidence to suggest congestive heart failure.  EKG is nonischemic.  Lab work is very reassuring, with no major leukocytosis, anemia, and electrolytes are at baseline.  D-dimer is negative, do not suspect PE.  BNP is normal.  Troponin negative.  Will treat with steroids, inhaler, and discharge.  Of note, the patient likely would benefit from an outpatient controller inhaler which I will prescribe for him to start after his current prednisone course.   FINAL CLINICAL IMPRESSION(S) / ED DIAGNOSES   Final diagnoses:  Upper respiratory tract infection, unspecified type  Wheezing-associated respiratory infection (WARI)     Rx / DC Orders   ED Discharge Orders          Ordered    predniSONE (DELTASONE) 20 MG tablet  Daily        03/07/23 1843    azithromycin (ZITHROMAX Z-PAK) 250 MG tablet        03/07/23 1843    budesonide-formoterol (SYMBICORT) 160-4.5 MCG/ACT inhaler  2 times daily        03/07/23 1843             Note:  This document was prepared using Dragon voice recognition software and may include unintentional dictation errors.   Shaune Pollack, MD 03/07/23 [phone]

## 2023-03-07 NOTE — ED Notes (Signed)
Pt reports sob and chest tightness for 1 week, worse past 2-3 days.  No fevers.  Denies chest pain.  Pt has a cough and congestion.  Pt alert speech clear.  Iv started and meds given.  Md at bedside on arrival to treatment room

## 2023-03-07 NOTE — ED Triage Notes (Signed)
SHOB and congestion for "quite a while" but has worsened over the last 3 days

## 2023-03-11 ENCOUNTER — Other Ambulatory Visit: Payer: Self-pay

## 2023-03-11 ENCOUNTER — Emergency Department
Admission: EM | Admit: 2023-03-11 | Discharge: 2023-03-11 | Disposition: A | Discharge status: Home / Self Care | Payer: Non-veteran care | Attending: Emergency Medicine | Admitting: Emergency Medicine

## 2023-03-11 ENCOUNTER — Emergency Department: Payer: Non-veteran care

## 2023-03-11 DIAGNOSIS — Z1152 Encounter for screening for COVID-19: Secondary | ICD-10-CM | POA: Insufficient documentation

## 2023-03-11 DIAGNOSIS — R0602 Shortness of breath: Secondary | ICD-10-CM | POA: Diagnosis present

## 2023-03-11 DIAGNOSIS — J4521 Mild intermittent asthma with (acute) exacerbation: Secondary | ICD-10-CM | POA: Insufficient documentation

## 2023-03-11 DIAGNOSIS — B349 Viral infection, unspecified: Secondary | ICD-10-CM | POA: Insufficient documentation

## 2023-03-11 LAB — CBC
HCT: 37.4 % — ABNORMAL LOW (ref 39.0–52.0)
Hemoglobin: 12.8 g/dL — ABNORMAL LOW (ref 13.0–17.0)
MCH: 30 pg (ref 26.0–34.0)
MCHC: 34.2 g/dL (ref 30.0–36.0)
MCV: 87.8 fL (ref 80.0–100.0)
Platelets: 305 10*3/uL (ref 150–400)
RBC: 4.26 MIL/uL (ref 4.22–5.81)
RDW: 11.5 % (ref 11.5–15.5)
WBC: 13 10*3/uL — ABNORMAL HIGH (ref 4.0–10.5)
nRBC: 0 % (ref 0.0–0.2)

## 2023-03-11 LAB — BASIC METABOLIC PANEL
Anion gap: 9 (ref 5–15)
BUN: 20 mg/dL (ref 8–23)
CO2: 23 mmol/L (ref 22–32)
Calcium: 9.3 mg/dL (ref 8.9–10.3)
Chloride: 104 mmol/L (ref 98–111)
Creatinine, Ser: 1.49 mg/dL — ABNORMAL HIGH (ref 0.61–1.24)
GFR, Estimated: 50 mL/min — ABNORMAL LOW (ref >60)
Glucose, Bld: 131 mg/dL — ABNORMAL HIGH (ref 70–99)
Potassium: 3.9 mmol/L (ref 3.5–5.1)
Sodium: 136 mmol/L (ref 135–145)

## 2023-03-11 LAB — RESP PANEL BY RT-PCR (RSV, FLU A&B, COVID)  RVPGX2
Influenza A by PCR: NEGATIVE
Influenza B by PCR: NEGATIVE
Resp Syncytial Virus by PCR: NEGATIVE
SARS Coronavirus 2 by RT PCR: NEGATIVE

## 2023-03-11 LAB — TROPONIN I (HIGH SENSITIVITY): Troponin I (High Sensitivity): 3 ng/L (ref <18)

## 2023-03-11 MED ORDER — METHYLPREDNISOLONE SODIUM SUCC 125 MG IJ SOLR
125.0000 mg | Freq: Once | INTRAMUSCULAR | Status: AC
  Administered 2023-03-11: 125 mg via INTRAVENOUS
  Filled 2023-03-11: qty 2

## 2023-03-11 MED ORDER — IOHEXOL 350 MG/ML SOLN
75.0000 mL | Freq: Once | INTRAVENOUS | Status: AC | PRN
  Administered 2023-03-11: 75 mL via INTRAVENOUS

## 2023-03-11 MED ORDER — IPRATROPIUM-ALBUTEROL 0.5-2.5 (3) MG/3ML IN SOLN
3.0000 mL | Freq: Once | RESPIRATORY_TRACT | Status: AC
  Administered 2023-03-11: 3 mL via RESPIRATORY_TRACT
  Filled 2023-03-11: qty 3

## 2023-03-11 MED ORDER — DEXAMETHASONE SODIUM PHOSPHATE 10 MG/ML IJ SOLN
10.0000 mg | Freq: Once | INTRAMUSCULAR | Status: AC
  Administered 2023-03-11: 10 mg via INTRAVENOUS
  Filled 2023-03-11: qty 1

## 2023-03-11 NOTE — ED Triage Notes (Addendum)
Pt comes with c/o increased sob for awhile. Pt states he was seen here recently for the same. Pt states it has just gotten worse today. Pt denies any cp.   Pt states more of sob when walking. Pt does have little labored breathing noted.

## 2023-03-11 NOTE — ED Notes (Signed)
Patient transported to CT 

## 2023-03-11 NOTE — ED Provider Notes (Signed)
[facility] Provider Note  Patient Contact: 5:58 PM (approximate)   History   Shortness of Breath   HPI  Jeremy Frazier is a 72 y.o. male who presents the emergency department complaining of worsening shortness of breath.  Patient was seen 4 days ago for similar complaint.  After workup it was determined that patient was having viral infection with increased asthma symptoms.  He was prescribed medications but has only started steroid and has not used the other inhaler.  Patient states that he is having worsening of his shortness of breath.  No fevers, chills, chest pain.  No abdominal pain.  Patient is still ambulatory but states that increased activity increases his shortness of breath.  Patient arrives with reassuring vital signs with no evidence of hypoxia.     Physical Exam   Triage Vital Signs: ED Triage Vitals  Encounter Vitals Group     BP 03/11/23 1128 (!) 130/90     Systolic BP Percentile --      Diastolic BP Percentile --      Pulse Rate 03/11/23 1128 85     Resp 03/11/23 1128 20     Temp 03/11/23 1128 98.9 F (37.2 C)     Temp src --      SpO2 03/11/23 1128 96 %     Weight 03/11/23 1127 220 lb (99.8 kg)     Height 03/11/23 1127 6' (1.829 m)     Head Circumference --      Peak Flow --      Pain Score 03/11/23 1124 0     Pain Loc --      Pain Education --      Exclude from Growth Chart --     Most recent vital signs: Vitals:   03/11/23 1716 03/11/23 1923  BP: 136/87 126/78  Pulse: 61 60  Resp: 19 20  Temp:  97.9 F (36.6 C)  SpO2: 100% 98%     General: Alert and in no acute distress. ENT:      Ears:       Nose: No congestion/rhinnorhea.      Mouth/Throat: Mucous membranes are moist. Neck: No stridor. No cervical spine tenderness to palpation.  Cardiovascular:  Good peripheral perfusion Respiratory: Normal respiratory effort without tachypnea or retractions. Lungs with mild rhonchi versus minimal wheeze to the right lower  lung field.  No other adventitious breath sounds.Peri Jefferson air entry to the bases with no decreased or absent breath sounds. Musculoskeletal: Full range of motion to all extremities.  Neurologic:  No gross focal neurologic deficits are appreciated.  Skin:   No rash noted Other:   ED Results / Procedures / Treatments   Labs (all labs ordered are listed, but only abnormal results are displayed) Labs Reviewed  BASIC METABOLIC PANEL - Abnormal; Notable for the following components:      Result Value   Glucose, Bld 131 (*)    Creatinine, Ser 1.49 (*)    GFR, Estimated 50 (*)    All other components within normal limits  CBC - Abnormal; Notable for the following components:   WBC 13.0 (*)    Hemoglobin 12.8 (*)    HCT 37.4 (*)    All other components within normal limits  RESP PANEL BY RT-PCR (RSV, FLU A&B, COVID)  RVPGX2  TROPONIN I (HIGH SENSITIVITY)    EKG  ED ECG REPORT I, Delorise Royals Shaylah Mcghie,  personally viewed and interpreted this ECG.   Date: 03/11/2023  EKG Time: 1125 hrs.  Rate: 78 bpm  Rhythm: unchanged from previous tracings, normal sinus rhythm  Axis: Normal axis  Intervals:none  ST&T Change: No gross ST elevation or depression noted   Normal sinus rhythm.  No STEMI.  No significant change from previous EKG.    RADIOLOGY  I personally viewed, evaluated, and interpreted these images as part of my medical decision making, as well as reviewing the written report by the radiologist.  ED Provider Interpretation: No acute cardiopulmonary finding on chest x-ray.  CT scan revealed no evidence of PE or pneumonia.  CT Angio Chest PE W and/or Wo Contrast  Result Date: 03/11/2023 CLINICAL DATA:  Pulmonary embolism (PE) suspected, high prob Shortness of breath. EXAM: CT ANGIOGRAPHY CHEST WITH CONTRAST TECHNIQUE: Multidetector CT imaging of the chest was performed using the standard protocol during bolus administration of intravenous contrast. Multiplanar CT image  reconstructions and MIPs were obtained to evaluate the vascular anatomy. RADIATION DOSE REDUCTION: This exam was performed according to the departmental dose-optimization program which includes automated exposure control, adjustment of the mA and/or kV according to patient size and/or use of iterative reconstruction technique. CONTRAST:  75mL OMNIPAQUE IOHEXOL 350 MG/ML SOLN COMPARISON:  Radiograph earlier today. Additional prior radiographs reviewed FINDINGS: Cardiovascular: There are no filling defects within the pulmonary arteries to suggest pulmonary embolus. No heart is normal in size. No pericardial effusion. The thoracic aorta is normal in caliber. Minor atherosclerosis. Common origin of the brachiocephalic and left common carotid artery. Aberrant right subclavian artery. No acute aortic findings. Mild coronary artery calcifications. Mediastinum/Nodes: No mediastinal or hilar adenopathy. No thyroid nodule. No esophageal wall thickening. Lungs/Pleura: No focal airspace disease. No pleural effusion. No features of pulmonary edema. Trachea and central airways are clear. Calcified granuloma in the right lower lobe. Upper Abdomen: Simple cysts in the right kidney. No further follow-up imaging is recommended. No acute upper abdominal findings. Musculoskeletal: Scoliosis and degenerative change in the spine. 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 There are no acute or suspicious osseous abnormalities. Review of the MIP images confirms the above findings. IMPRESSION: 1. No pulmonary embolus or acute intrathoracic abnormality. 2. Mild coronary artery calcifications. Aortic Atherosclerosis (ICD10-I70.0). 3. 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 4. 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 Electronically Signed   By: Narda Rutherford M.D.   On: 03/11/2023 19:25   DG Chest 2 View  Result Date: 03/11/2023 CLINICAL DATA:  Shortness of breath. EXAM:  CHEST - 2 VIEW COMPARISON:  March 07, 2023. FINDINGS: The heart size and mediastinal contours are within normal limits. Both lungs are clear. Thoracic kyphosis is noted due to multiple old thoracic compression fractures. IMPRESSION: No active cardiopulmonary disease. Electronically Signed   By: Lupita Raider M.D.   On: 03/11/2023 12:47    PROCEDURES:  Critical Care performed: No  Procedures   MEDICATIONS ORDERED IN ED: Medications  dexamethasone (DECADRON) injection 10 mg (has no administration in time range)  ipratropium-albuterol (DUONEB) 0.5-2.5 (3) MG/3ML nebulizer solution 3 mL (3 mLs Nebulization Given 03/11/23 1700)  methylPREDNISolone sodium succinate (SOLU-MEDROL) 125 mg/2 mL injection 125 mg (125 mg Intravenous Given 03/11/23 1702)  iohexol (OMNIPAQUE) 350 MG/ML injection 75 mL (75 mLs Intravenous Contrast Given 03/11/23 1719)     IMPRESSION / MDM / ASSESSMENT AND PLAN / ED COURSE  I reviewed the triage vital signs and the nursing notes.  Differential diagnosis includes, but is not limited to, viral illness, COVID, flu, asthma exacerbation, bronchitis, pneumonia   Patient's presentation is most consistent with acute presentation with potential threat to life or bodily function.   Patient's diagnosis is consistent with viral illness, asthma exacerbation.  Patient presents with shortness of breath, cough.  Was seen 4 days ago, had reassuring workup at that time.  Patient was prescribed steroids and Symbicort.  Patient has taken the steroids but has not been taking his albuterol or Symbicort.  Patient had some mild wheeze/bronchial rub on auscultation.  Vital signs are reassuring.  Patient did not show any evidence of hypoxia throughout his ED course.  Patient had reassuring labs, EKG, chest x-ray and CT scan.  He feels much improved after Solu-Medrol and breathing treatment.  I have advised the patient to continue his inhalers.  He also has a  nebulizer machine at home that he can use.  Suspect viral illness with asthma exacerbation component.  No indication for admission currently.  Return precautions discussed with the patient.  Follow-up primary care..  Patient is given ED precautions to return to the ED for any worsening or new symptoms.     FINAL CLINICAL IMPRESSION(S) / ED DIAGNOSES   Final diagnoses:  Viral illness  Mild intermittent asthma with exacerbation     Rx / DC Orders   ED Discharge Orders     None        Note:  This document was prepared using Dragon voice recognition software and may include unintentional dictation errors.   Lanette Hampshire 03/11/23 Mable Paris, MD 03/11/23 0454    Dionne Bucy, MD 03/11/23 2026

## 2023-08-26 ENCOUNTER — Emergency Department

## 2023-08-26 ENCOUNTER — Other Ambulatory Visit: Payer: Self-pay

## 2023-08-26 ENCOUNTER — Emergency Department
Admission: EM | Admit: 2023-08-26 | Discharge: 2023-08-26 | Disposition: A | Discharge status: Home / Self Care | Attending: Emergency Medicine | Admitting: Emergency Medicine

## 2023-08-26 DIAGNOSIS — R519 Headache, unspecified: Secondary | ICD-10-CM | POA: Diagnosis not present

## 2023-08-26 DIAGNOSIS — R0981 Nasal congestion: Secondary | ICD-10-CM

## 2023-08-26 DIAGNOSIS — J069 Acute upper respiratory infection, unspecified: Secondary | ICD-10-CM | POA: Diagnosis not present

## 2023-08-26 DIAGNOSIS — R059 Cough, unspecified: Secondary | ICD-10-CM | POA: Diagnosis present

## 2023-08-26 HISTORY — DX: Essential (primary) hypertension: I10

## 2023-08-26 LAB — RESP PANEL BY RT-PCR (RSV, FLU A&B, COVID)  RVPGX2
Influenza A by PCR: NEGATIVE
Influenza B by PCR: NEGATIVE
Resp Syncytial Virus by PCR: NEGATIVE
SARS Coronavirus 2 by RT PCR: NEGATIVE

## 2023-08-26 MED ORDER — PSEUDOEPHEDRINE HCL 30 MG PO TABS: 30.0000 mg | ORAL_TABLET | Freq: Four times a day (QID) | ORAL | 2 refills | Status: AC | PRN

## 2023-08-26 MED ORDER — FLUTICASONE PROPIONATE 50 MCG/ACT NA SUSP
1.0000 | Freq: Every day | NASAL | 0 refills | Status: AC
Start: 2023-08-26 — End: 2024-08-25

## 2023-08-26 NOTE — Discharge Instructions (Signed)
 You were seen in the emergency department today for nasal congestion.  Your respiratory panel which includes influenza, RSV and COVID is negative.  Your chest x-ray is normal and does not show pneumonia.  This is more than likely a viral upper respiratory infection.  A viral cough may last up to 2-3 weeks.   Take tylenol or ibuprofen for pain or fever as directed.  Pick up Alka-Seltzer from over-the-counter cold & cough tablets to mix in with water twice daily.  Stay hydrated by drinking plenty of fluids to thin mucus. Get adequate amount of sleep and avoid overexertion. Consider a humidifier at night. Warm teas and a spoonful of honey may help reduce cough frequency. Follow up with your primary care provider.

## 2023-08-26 NOTE — ED Provider Notes (Signed)
 [facility] Emergency Department Provider Note     Event Date/Time   First MD Initiated Contact with Patient 08/26/23 1728     (approximate)   History   Nasal Congestion   HPI  Jeremy Frazier is a 73 y.o. male with a history of HTN, asthma presents to the ED for evaluation of nasal congestion and cough for couple days.  Patient reports he has tried taking mucinex and DayQuil without any relief.  Associated symptoms includes headache.  Unknown sick contacts.  Patient denies chest pain, shortness of breath and vomiting. Denies fever.       Physical Exam   Triage Vital Signs: ED Triage Vitals  Encounter Vitals Group     BP 08/26/23 1459 (!) 141/102     Systolic BP Percentile --      Diastolic BP Percentile --      Pulse Rate 08/26/23 1459 64     Resp 08/26/23 1459 17     Temp 08/26/23 1459 98 F (36.7 C)     Temp Source 08/26/23 1459 Oral     SpO2 08/26/23 1459 99 %     Weight 08/26/23 1501 220 lb (99.8 kg)     Height 08/26/23 1501 6' (1.829 m)     Head Circumference --      Peak Flow --      Pain Score 08/26/23 1501 0     Pain Loc --      Pain Education --      Exclude from Growth Chart --     Most recent vital signs: Vitals:   08/26/23 1459  BP: (!) 141/102  Pulse: 64  Resp: 17  Temp: 98 F (36.7 C)  SpO2: 99%    General: Well appearing. Alert and oriented. INAD.  Skin:  Warm, dry and intact. No rashes or lesions noted.     Head:  NCAT.  Eyes:  PERRLA. EOMI.  Nose:   Mucosa is moist. No rhinorrhea. CV:  Good peripheral perfusion. RRR.  RESP:  Normal effort. LCTAB. No retractions.  NEURO: Cranial nerves intact. No focal deficits.  ED Results / Procedures / Treatments   Labs (all labs ordered are listed, but only abnormal results are displayed) Labs Reviewed  RESP PANEL BY RT-PCR (RSV, FLU A&B, COVID)  RVPGX2   RADIOLOGY  I personally viewed and evaluated these images as part of my medical decision making, as well as  reviewing the written report by the radiologist.  ED Provider Interpretation: Chest x-ray reveals no focal consolidation.  DG Chest 2 View Result Date: 08/26/2023 CLINICAL DATA:  Cough nasal congestion EXAM: CHEST - 2 VIEW COMPARISON:  03/11/2023 FINDINGS: The heart size and mediastinal contours are within normal limits. Both lungs are clear. Chronic mid to lower thoracic compression deformities. IMPRESSION: No active cardiopulmonary disease. Electronically Signed   By: Jasmine Pang M.D.   On: 08/26/2023 18:06    PROCEDURES:  Critical Care performed: No  Procedures   MEDICATIONS ORDERED IN ED: Medications - No data to display   IMPRESSION / MDM / ASSESSMENT AND PLAN / ED COURSE  I reviewed the triage vital signs and the nursing notes.                               73 y.o. male presents to the emergency department for evaluation and treatment of nasal congestion. See HPI for further details.   Differential diagnosis  includes, but is not limited to viral URI, bronchitis, PNA  Patient's presentation is most consistent with acute complicated illness / injury requiring diagnostic workup.  Patient's presentation is clinically consistent with viral URI.  Respiratory panel is reassuring.  Chest x-ray does not reveal pneumonia.  Symptomatic care treatment at home discussed with patient.  He is more concerned of his congestion.  I recommended OTC Alka-Seltzer and Sudafed for decongestion.  Patient is in stable condition for discharge home.  ED return precautions discussed.  Encouraged follow-up with primary care for further evaluation if needed.   FINAL CLINICAL IMPRESSION(S) / ED DIAGNOSES   Final diagnoses:  Nasal congestion  Acute upper respiratory infection     Rx / DC Orders   ED Discharge Orders          Ordered    fluticasone (FLONASE) 50 MCG/ACT nasal spray  Daily        08/26/23 1746    pseudoephedrine (SUDAFED) 30 MG tablet  Every 6 hours PRN        08/26/23 1746              Note:  This document was prepared using Dragon voice recognition software and may include unintentional dictation errors.    Romeo Apple, Zain Bingman A, PA-C 08/26/23 2342    Chesley Noon, MD 08/29/23 [phone]

## 2023-08-26 NOTE — ED Triage Notes (Signed)
 Pt to ED for nasal congestion, runny nose, cough for a few days. NAD noted. Denies fevers.

## 2023-12-27 ENCOUNTER — Other Ambulatory Visit: Payer: Self-pay

## 2023-12-27 ENCOUNTER — Emergency Department

## 2023-12-27 ENCOUNTER — Emergency Department
Admission: EM | Admit: 2023-12-27 | Discharge: 2023-12-27 | Disposition: A | Discharge status: Home / Self Care | Attending: Emergency Medicine | Admitting: Emergency Medicine

## 2023-12-27 DIAGNOSIS — J45909 Unspecified asthma, uncomplicated: Secondary | ICD-10-CM | POA: Insufficient documentation

## 2023-12-27 DIAGNOSIS — I10 Essential (primary) hypertension: Secondary | ICD-10-CM | POA: Diagnosis not present

## 2023-12-27 DIAGNOSIS — R0602 Shortness of breath: Secondary | ICD-10-CM | POA: Diagnosis present

## 2023-12-27 DIAGNOSIS — J441 Chronic obstructive pulmonary disease with (acute) exacerbation: Secondary | ICD-10-CM | POA: Diagnosis not present

## 2023-12-27 LAB — BASIC METABOLIC PANEL WITH GFR
Anion gap: 11 (ref 5–15)
BUN: 15 mg/dL (ref 8–23)
CO2: 23 mmol/L (ref 22–32)
Calcium: 9.1 mg/dL (ref 8.9–10.3)
Chloride: 104 mmol/L (ref 98–111)
Creatinine, Ser: 1.51 mg/dL — ABNORMAL HIGH (ref 0.61–1.24)
GFR, Estimated: 49 mL/min — ABNORMAL LOW (ref >60)
Glucose, Bld: 124 mg/dL — ABNORMAL HIGH (ref 70–99)
Potassium: 3.4 mmol/L — ABNORMAL LOW (ref 3.5–5.1)
Sodium: 138 mmol/L (ref 135–145)

## 2023-12-27 LAB — TROPONIN I (HIGH SENSITIVITY)
Troponin I (High Sensitivity): 4 ng/L (ref <18)
Troponin I (High Sensitivity): 4 ng/L (ref <18)

## 2023-12-27 LAB — CBC WITH DIFFERENTIAL/PLATELET
Abs Immature Granulocytes: 0.01 K/uL (ref 0.00–0.07)
Basophils Absolute: 0.1 K/uL (ref 0.0–0.1)
Basophils Relative: 1 %
Eosinophils Absolute: 0.7 K/uL — ABNORMAL HIGH (ref 0.0–0.5)
Eosinophils Relative: 15 %
HCT: 39.9 % (ref 39.0–52.0)
Hemoglobin: 12.8 g/dL — ABNORMAL LOW (ref 13.0–17.0)
Immature Granulocytes: 0 %
Lymphocytes Relative: 23 %
Lymphs Abs: 1 K/uL (ref 0.7–4.0)
MCH: 29.4 pg (ref 26.0–34.0)
MCHC: 32.1 g/dL (ref 30.0–36.0)
MCV: 91.7 fL (ref 80.0–100.0)
Monocytes Absolute: 0.6 K/uL (ref 0.1–1.0)
Monocytes Relative: 13 %
Neutro Abs: 2.1 K/uL (ref 1.7–7.7)
Neutrophils Relative %: 48 %
Platelets: 277 K/uL (ref 150–400)
RBC: 4.35 MIL/uL (ref 4.22–5.81)
RDW: 11.9 % (ref 11.5–15.5)
WBC: 4.5 K/uL (ref 4.0–10.5)
nRBC: 0 % (ref 0.0–0.2)

## 2023-12-27 MED ORDER — PREDNISONE 20 MG PO TABS: 40.0000 mg | ORAL_TABLET | Freq: Every day | ORAL | 0 refills | Status: AC

## 2023-12-27 MED ORDER — IOHEXOL 350 MG/ML SOLN
75.0000 mL | Freq: Once | INTRAVENOUS | Status: AC | PRN
  Administered 2023-12-27: 75 mL via INTRAVENOUS

## 2023-12-27 MED ORDER — METHYLPREDNISOLONE SODIUM SUCC 125 MG IJ SOLR
125.0000 mg | INTRAMUSCULAR | Status: AC
  Administered 2023-12-27: 125 mg via INTRAVENOUS
  Filled 2023-12-27: qty 2

## 2023-12-27 MED ORDER — ASPIRIN 81 MG PO CHEW
324.0000 mg | CHEWABLE_TABLET | Freq: Once | ORAL | Status: AC
  Administered 2023-12-27: 324 mg via ORAL
  Filled 2023-12-27: qty 4

## 2023-12-27 MED ORDER — NITROGLYCERIN 0.4 MG SL SUBL
0.4000 mg | SUBLINGUAL_TABLET | SUBLINGUAL | Status: DC | PRN
  Administered 2023-12-27: 0.4 mg via SUBLINGUAL
  Filled 2023-12-27: qty 1

## 2023-12-27 MED ORDER — DOXYCYCLINE HYCLATE 100 MG PO CAPS: 100.0000 mg | ORAL_CAPSULE | Freq: Two times a day (BID) | ORAL | 0 refills | Status: AC

## 2023-12-27 MED ORDER — ALBUTEROL SULFATE (2.5 MG/3ML) 0.083% IN NEBU
5.0000 mg | INHALATION_SOLUTION | Freq: Once | RESPIRATORY_TRACT | Status: AC
  Administered 2023-12-27: 5 mg via RESPIRATORY_TRACT
  Filled 2023-12-27: qty 6

## 2023-12-27 MED ORDER — IPRATROPIUM-ALBUTEROL 0.5-2.5 (3) MG/3ML IN SOLN: 3.0000 mL | RESPIRATORY_TRACT | 3 refills | Status: AC | PRN

## 2023-12-27 NOTE — ED Provider Notes (Signed)
 [facility] Provider Note    Event Date/Time   First MD Initiated Contact with Patient 12/27/23 [phone]     (approximate)   History   Chief Complaint: Shortness of Breath   HPI  Carlton Buskey is a 73 y.o. male with history of asthma, hypertension who comes ED complaining of shortness of breath that started last night, constant, denies chest pain, but chest does feel tight.  This feels typical of his asthma exacerbations except that normally it will resolve after using his bronchodilator at home and last night and this morning it has not.  No pain or worsening dyspnea with exertion.  No fever or chills or increased cough.         Past Medical History:  Diagnosis Date   Asthma    Hypertension    Pancreatitis     Current Outpatient Rx   Order #: [id number] Class: Normal   Order #: [id number] Class: Normal   Order #: [id number] Class: Normal   Order #: [id number] Class: Normal   Order #: [id number] Class: Normal   Order #: [id number] Class: Normal   Order #: [id number] Class: Normal    Past Surgical History:  Procedure Laterality Date   HERNIA REPAIR     PARATHYROIDECTOMY      Physical Exam   Triage Vital Signs: ED Triage Vitals  Encounter Vitals Group     BP --      Girls Systolic BP Percentile --      Girls Diastolic BP Percentile --      Boys Systolic BP Percentile --      Boys Diastolic BP Percentile --      Pulse --      Resp --      Temp 12/27/23 0908 98 F (36.7 C)     Temp Source 12/27/23 0908 Oral     SpO2 --      Weight --      Height --      Head Circumference --      Peak Flow --      Pain Score 12/27/23 0907 0     Pain Loc --      Pain Education --      Exclude from Growth Chart --     Most recent vital signs: Vitals:   12/27/23 1000 12/27/23 1300  BP: 126/77 137/83  Pulse: 82 86  Resp: (!) 21 (!) 22  Temp:    SpO2: 100% 99%    General: Awake, no distress.  CV:  Good peripheral perfusion.  Regular rate rhythm,  heart rate 90.  Symmetric distal pulses Resp:  Normal effort.  Symmetric air movement.  Inducible expiratory wheezing with FEV1 maneuver, normal expiratory phase Abd:  No distention.  Soft nontender Other:  No lower extremity edema or calf tenderness.  Symmetric calf circumference.   ED Results / Procedures / Treatments   Labs (all labs ordered are listed, but only abnormal results are displayed) Labs Reviewed  BASIC METABOLIC PANEL WITH GFR - Abnormal; Notable for the following components:      Result Value   Potassium 3.4 (*)    Glucose, Bld 124 (*)    Creatinine, Ser 1.51 (*)    GFR, Estimated 49 (*)    All other components within normal limits  CBC WITH DIFFERENTIAL/PLATELET - Abnormal; Notable for the following components:   Hemoglobin 12.8 (*)    Eosinophils Absolute 0.7 (*)    All other components within normal limits  TROPONIN I (HIGH SENSITIVITY)  TROPONIN I (HIGH SENSITIVITY)     EKG Interpreted by me Sinus rhythm rate of 99.  Normal axis and intervals.  Normal QRS ST segments and T waves.  No ischemic changes.   RADIOLOGY Chest x-ray interpreted by me, unremarkable.  Radiology report reviewed   PROCEDURES:  Procedures   MEDICATIONS ORDERED IN ED: Medications  nitroGLYCERIN  (NITROSTAT ) SL tablet 0.4 mg (0.4 mg Sublingual Given 12/27/23 1224)  albuterol  (PROVENTIL ) (2.5 MG/3ML) 0.083% nebulizer solution 5 mg (5 mg Nebulization Given 12/27/23 0916)  methylPREDNISolone  sodium succinate (SOLU-MEDROL ) 125 mg/2 mL injection 125 mg (125 mg Intravenous Given 12/27/23 0919)  aspirin  chewable tablet 324 mg (324 mg Oral Given 12/27/23 1224)  iohexol  (OMNIPAQUE ) 350 MG/ML injection 75 mL (75 mLs Intravenous Contrast Given 12/27/23 1445)     IMPRESSION / MDM / ASSESSMENT AND PLAN / ED COURSE  I reviewed the triage vital signs and the nursing notes.  DDx: Asthma/COPD exacerbation, NSTEMI, pneumothorax, pneumonia, pleural effusion  Patient's presentation is most  consistent with acute presentation with potential threat to life or bodily function.  Patient presents with shortness of breath, most likely asthma exacerbation, however pulmonary exam is relatively mild.  Will check EKG and troponins to evaluate for NSTEMI the symptoms are not consistent with unstable angina.   Clinical Course as of 12/27/23 1455  Tue Dec 27, 2023  1112 Lungs clear.  Shortness of breath improved but still having chest tightness.  Initial troponin normal.  Will obtain CT chest. [PS]    Clinical Course User Index [PS] Viviann Pastor, MD    ----------------------------------------- 2:55 PM on 12/27/2023 ----------------------------------------- CT chest still pending.  Awaiting repeat troponin.  Vital signs are improved, patient is calm comfortable.  Anticipate discharge if the remaining workup is reassuring.   FINAL CLINICAL IMPRESSION(S) / ED DIAGNOSES   Final diagnoses:  COPD exacerbation (HCC)     Rx / DC Orders   ED Discharge Orders          Ordered    predniSONE  (DELTASONE ) 20 MG tablet  Daily with breakfast        12/27/23 1455    doxycycline  (VIBRAMYCIN ) 100 MG capsule  2 times daily        12/27/23 1455             Note:  This document was prepared using Dragon voice recognition software and may include unintentional dictation errors.   Viviann Pastor, MD 12/27/23 1455

## 2023-12-27 NOTE — ED Triage Notes (Signed)
 Pt BIB [facility] from home for SOB which started last night. Pt states he feels congested. Pt A&Ox4. Used inhaler and neb treatments at home w/o improvement.  EMS VS: P 105 BP 158/92 98% RA
